# Patient Record
Sex: Female | Born: 1995 | Race: White | Hispanic: No | Marital: Single | Smoking: Former smoker
Health system: Southern US, Community
[De-identification: ages and names within clinical notes are randomized; demographics above are authoritative.]

## PROBLEM LIST (undated history)

## (undated) ENCOUNTER — Inpatient Hospital Stay (HOSPITAL_COMMUNITY): Payer: Self-pay

## (undated) DIAGNOSIS — F32A Depression, unspecified: Secondary | ICD-10-CM

## (undated) DIAGNOSIS — O24419 Gestational diabetes mellitus in pregnancy, unspecified control: Secondary | ICD-10-CM

## (undated) DIAGNOSIS — N200 Calculus of kidney: Secondary | ICD-10-CM

## (undated) DIAGNOSIS — N10 Acute tubulo-interstitial nephritis: Secondary | ICD-10-CM

## (undated) DIAGNOSIS — G43909 Migraine, unspecified, not intractable, without status migrainosus: Secondary | ICD-10-CM

## (undated) DIAGNOSIS — E282 Polycystic ovarian syndrome: Secondary | ICD-10-CM

## (undated) DIAGNOSIS — F419 Anxiety disorder, unspecified: Secondary | ICD-10-CM

## (undated) DIAGNOSIS — F329 Major depressive disorder, single episode, unspecified: Secondary | ICD-10-CM

## (undated) HISTORY — DX: Polycystic ovarian syndrome: E28.2

## (undated) HISTORY — DX: Depression, unspecified: F32.A

## (undated) HISTORY — DX: Calculus of kidney: N20.0

## (undated) HISTORY — PX: OTHER SURGICAL HISTORY: SHX169

## (undated) HISTORY — DX: Gestational diabetes mellitus in pregnancy, unspecified control: O24.419

## (undated) HISTORY — DX: Anxiety disorder, unspecified: F41.9

## (undated) HISTORY — PX: TONSILLECTOMY AND ADENOIDECTOMY: SHX28

## (undated) HISTORY — DX: Major depressive disorder, single episode, unspecified: F32.9

---
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
## 2017-01-28 IMAGING — DX DG KNEE COMPLETE 4+V*L*
4 series · 4 of 4 positions shown · non-contrast
Comparison: None.

CLINICAL DATA: Left knee pain when bearing weight for 2 days

EXAM:
LEFT KNEE - COMPLETE 4+ VIEW

[knee ap]
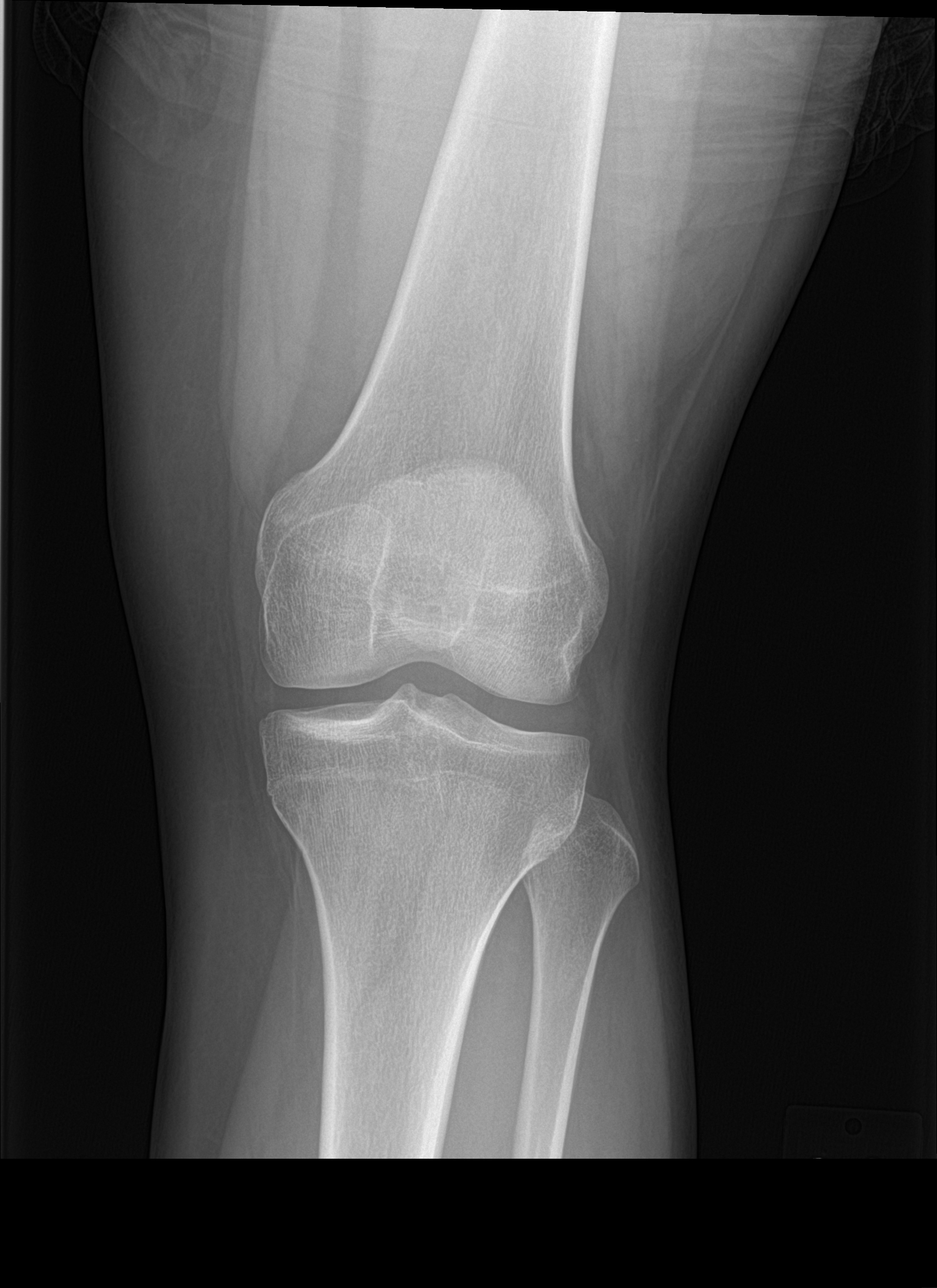

[knee lat]
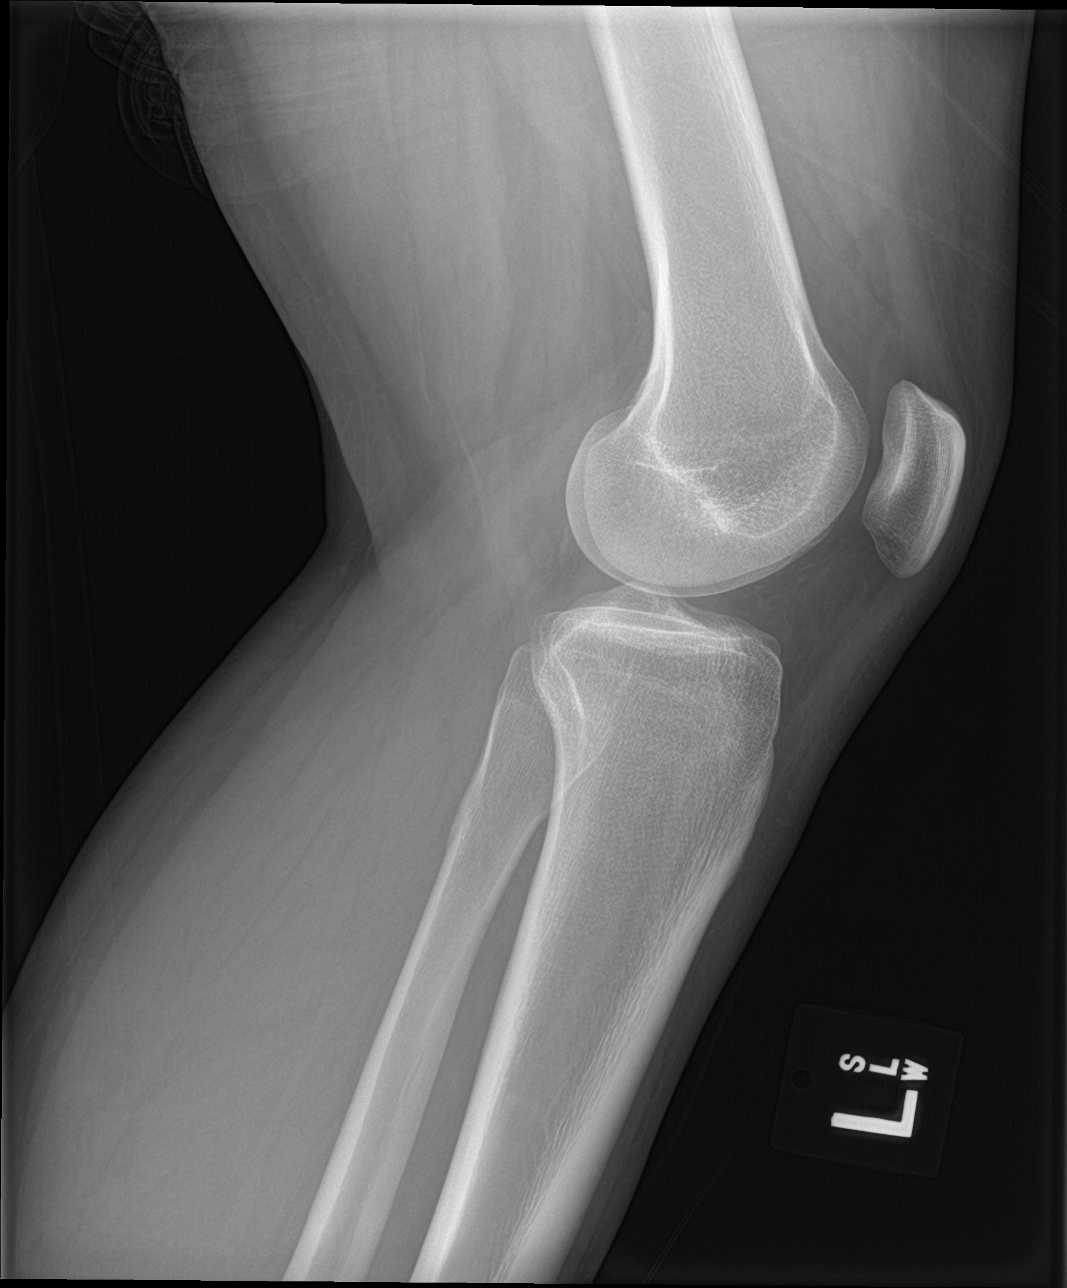

[knee obl (1 of 2)]
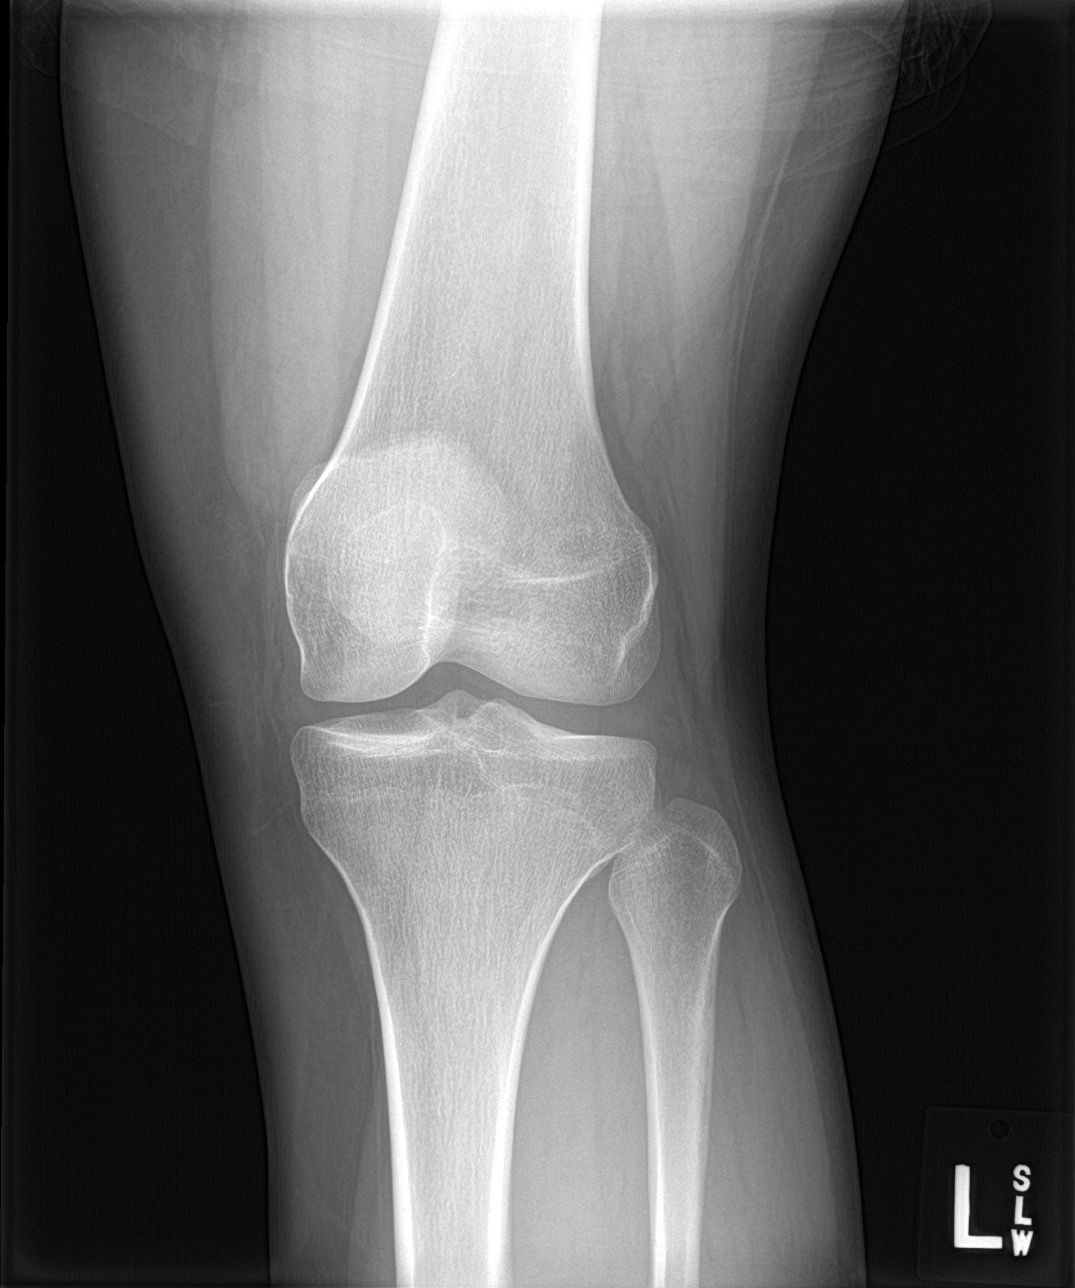

[knee obl (2 of 2)]
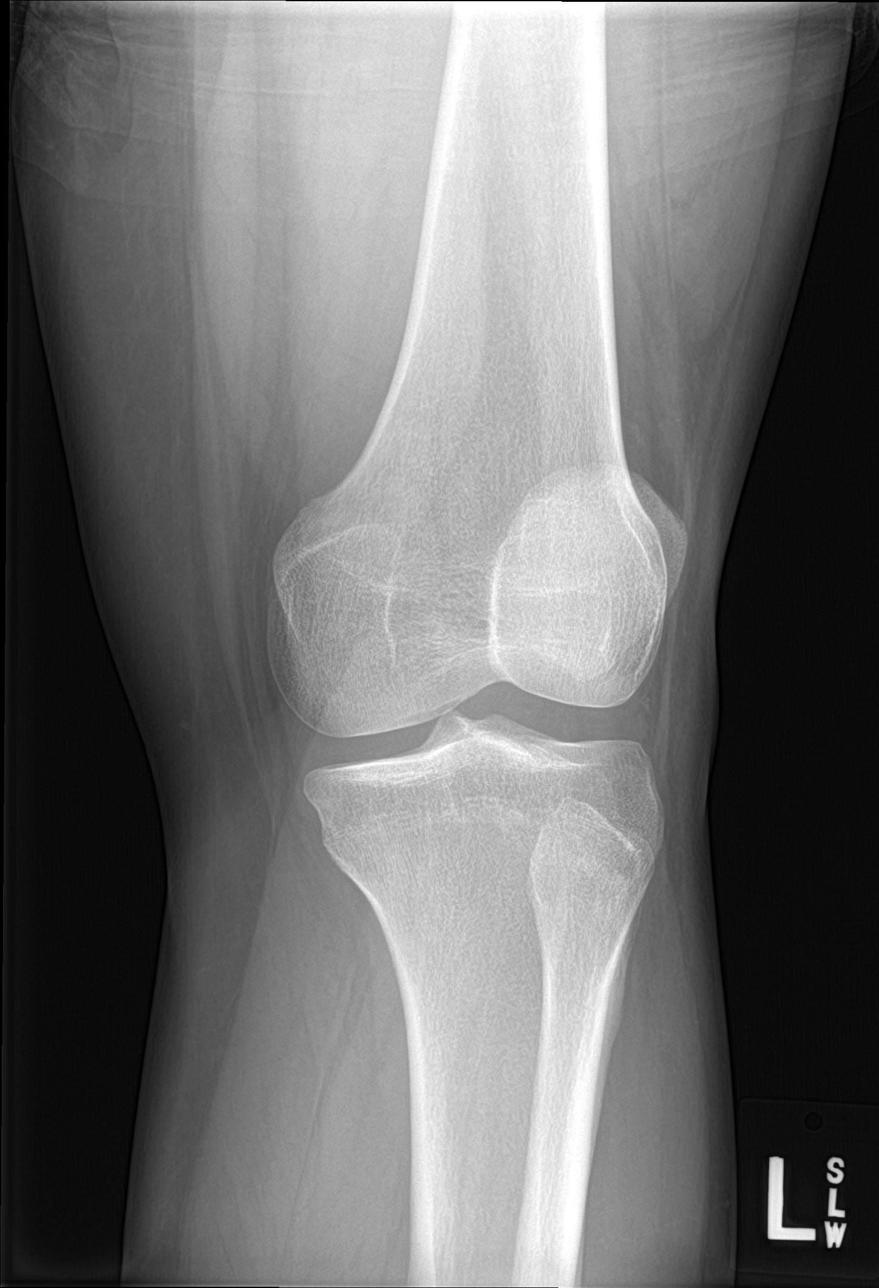

[4 of 4 positions shown; findings below may reference images not displayed]

FINDINGS: No evidence of fracture, dislocation, or joint effusion. No evidence
of arthropathy or other focal bone abnormality. Soft tissues are
unremarkable.
IMPRESSION: No acute osseous injury of the left knee.

## 2017-02-20 ENCOUNTER — Ambulatory Visit: Payer: Medicaid Other | Admitting: *Deleted

## 2017-05-23 NOTE — Progress Notes (Signed)
Chief Complaint  Patient presents with  . New Patient (Initial Visit)    female issues, discharge with foul odor x 2 weeks, tried gel supp and pills with no relieft continues to come back. Pt dates women is not sure if this is relevant to her condition    HPI  Vaginal odor Intermittent for 2 years Took the gel and the pills for bacterial infection  She is a lesbian Denies any pelvic pain Patient's last menstrual period was 05/18/2017. She uses strap on and dildos She washes them with every use She uses an organic lube with tea tree oil  anxiety and depression Patient reports anxiety and depression She has been treated with CBT and medications in the past She states that she has a stressful at home life  She states that she does not get along with her mom She states that she feels tension at home She states that she would want to please her mom but feels like she cannot She is not financially ready to move out on her own right now She states that she does not have a good relationship with her real father or her adoptive father She states that her sister passed away from accidental drowning a year ago She states that she has some racial tension in her relationship She is working right now and will be going to school She states that she works at [employer] at [employer]  GAD 7 : Generalized Anxiety Score 05/24/2017  Nervous, Anxious, on Edge 1  Control/stop worrying 2  Worry too much - different things 2  Trouble relaxing 3  Restless 3  Easily annoyed or irritable 3  Afraid - awful might happen 0  Total GAD 7 Score 14  Anxiety Difficulty Very difficult    Depression screen PHQ 2/9 05/24/2017  Decreased Interest 2  Down, Depressed, Hopeless 1  PHQ - 2 Score 3  Altered sleeping 3  Tired, decreased energy 3  Change in appetite 3  Feeling bad or failure about yourself  0  Trouble concentrating 0  Moving slowly or fidgety/restless 2  Suicidal thoughts 0  PHQ-9 Score  14      Past Medical History:  Diagnosis Date  . Anxiety   . Depression   . Migraine   . PCOS (polycystic ovarian syndrome)     Current Outpatient Medications  Medication Sig Dispense Refill  . fluconazole (DIFLUCAN) 150 MG tablet Take 1 tablet (150 mg total) by mouth once for 1 dose. Repeat in 3 day 2 tablet 0  . metroNIDAZOLE (FLAGYL) 500 MG tablet Take 1 tablet (500 mg total) by mouth 2 (two) times daily for 7 days. 14 tablet 0   No current facility-administered medications for this visit.     Allergies:  Allergies  Allergen Reactions  . Other Anaphylaxis, Cough and Rash    Dustmites, cats  . Sulfur Shortness Of Breath and Swelling  . Cat Hair Extract   . Influenza Vac Typ  [Flu Virus Vaccine]   . Omnicef [Cefdinir]   . Sulfa Antibiotics   . Sulfamethoxazole Swelling    Past Surgical History:  Procedure Laterality Date  . adnoidectomy and tonsilectomy    . TONSILLECTOMY AND ADENOIDECTOMY      Social History   Socioeconomic History  . Marital status: Single    Spouse name: None  . Number of children: None  . Years of education: None  . Highest education level: None  Social Needs  . Financial resource strain: None  .  Food insecurity - worry: None  . Food insecurity - inability: None  . Transportation needs - medical: None  . Transportation needs - non-medical: None  Occupational History  . None  Tobacco Use  . Smoking status: Never Smoker  . Smokeless tobacco: Never Used  Substance and Sexual Activity  . Alcohol use: No  . Drug use: No  . Sexual activity: Yes    Birth control/protection: None, Pill    Comment: With a woman  Other Topics Concern  . None  Social History Narrative  . None    Family History  Problem Relation Age of Onset  . Cancer Father   . Diabetes Father   . Diabetes Paternal Grandmother      ROS Review of Systems See HPI Constitution: No fevers or chills No malaise No diaphoresis Skin: No rash or itching Eyes: no  blurry vision, no double vision GU: no dysuria or hematuria Neuro: no dizziness or headaches  all others reviewed and negative   Objective: Vitals:   05/24/17 0810  BP: 112/76  Pulse: 95  Resp: 16  Temp: 98.7 F (37.1 C)  TempSrc: Oral  SpO2: 97%  Weight: 184 lb 3.2 oz (83.6 kg)  Height: 5' 4.25" (1.632 m)    Physical Exam  Constitutional: She is oriented to person, place, and time. She appears well-developed and well-nourished.  HENT:  Head: Normocephalic and atraumatic.  Eyes: Conjunctivae and EOM are normal.  Cardiovascular: Normal rate, regular rhythm and normal heart sounds.  No murmur heard. Pulmonary/Chest: Effort normal and breath sounds normal. No stridor. No respiratory distress.  Neurological: She is alert and oriented to person, place, and time.  Psychiatric: She has a normal mood and affect. Her behavior is normal. Judgment and thought content normal.   Vaginal exam- chaperone present Labia normal bilaterally without skin lesions Urethral meatus normal appearing without erythema Vagina with white, thick discharge No CMT, ovaries small and not palpable Uterus midline, nontender  Component     Latest Ref Rng & Units 05/24/2017  Yeast by KOH     Absent Absent  Yeast by wet prep     Absent Absent  WBC by wet prep     Few Few  Clue Cells Wet Prep HPF POC     None Too numerous to count (A)  Trich by wet prep     Absent Absent  Bacteria Wet Prep HPF POC     Few Too numerous to count (A)  Epithelial Cells By [employer]bermaidWet Pref ([facility])     None, Few, Too numerous to count None  RBC,UR,HPF,POC     None RBC/hpf None    Assessment and Plan Rudi was seen today for new patient (initial visit).  Diagnoses and all orders for this visit:  Vaginal discharge- will treat for BV and have pt take diflucan after completing the flagyl -     POCT Wet + KOH Prep  Anxiety and depression-  Advised continued counseling  Other orders -     Cancel: Flu Vaccine QUAD 36+ mos  IM -     Cancel: Tdap vaccine greater than or equal to 7yo IM -     fluconazole (DIFLUCAN) 150 MG tablet; Take 1 tablet (150 mg total) by mouth once for 1 dose. Repeat in 3 day -     metroNIDAZOLE (FLAGYL) 500 MG tablet; Take 1 tablet (500 mg total) by mouth 2 (two) times daily for 7 days.     Tylia Ewell A Keyleen Cerrato

## 2017-05-24 ENCOUNTER — Other Ambulatory Visit: Payer: Self-pay

## 2017-05-24 ENCOUNTER — Ambulatory Visit (INDEPENDENT_AMBULATORY_CARE_PROVIDER_SITE_OTHER): Payer: Self-pay | Admitting: Family Medicine

## 2017-05-24 ENCOUNTER — Encounter: Payer: Self-pay | Admitting: Family Medicine

## 2017-05-24 VITALS — BP 112/76 | HR 95 | Temp 98.7°F | Resp 16 | Ht 64.25 in | Wt 184.2 lb

## 2017-05-24 DIAGNOSIS — N898 Other specified noninflammatory disorders of vagina: Secondary | ICD-10-CM

## 2017-05-24 DIAGNOSIS — F419 Anxiety disorder, unspecified: Secondary | ICD-10-CM

## 2017-05-24 DIAGNOSIS — F329 Major depressive disorder, single episode, unspecified: Secondary | ICD-10-CM

## 2017-05-24 LAB — POCT WET + KOH PREP
TRICH BY WET PREP: ABSENT
YEAST BY KOH: ABSENT
YEAST BY WET PREP: ABSENT

## 2017-05-24 MED ORDER — METRONIDAZOLE 500 MG PO TABS: 500.0000 mg | ORAL_TABLET | Freq: Two times a day (BID) | ORAL | 0 refills | Status: AC

## 2017-05-24 MED ORDER — FLUCONAZOLE 150 MG PO TABS: 150.0000 mg | ORAL_TABLET | Freq: Once | ORAL | 0 refills | Status: AC

## 2017-05-24 NOTE — Patient Instructions (Addendum)
Use clear mineral oil as a lubricant  Take flagyl 500mg  by mouth in the morning and in the evening  After finishing flagyl start Diflucan one tab then repeat in 3 days     IF you received an x-ray today, you will receive an invoice from [facility]. Please contact [facility] at [phone] with questions or concerns regarding your invoice.   IF you received labwork today, you will receive an invoice from [place]. Please contact [facility] at [phone] with questions or concerns regarding your invoice.   Our billing staff will not be able to assist you with questions regarding bills from these companies.  You will be contacted with the lab results as soon as they are available. The fastest way to get your results is to activate your My Chart account. Instructions are located on the last page of this paperwork. If you have not heard from [place] regarding the results in 2 weeks, please contact this office.     Vaginitis Vaginitis is a condition in which the vaginal tissue swells and becomes red (inflamed). This condition is most often caused by a change in the normal balance of bacteria and yeast that live in the vagina. This change causes an overgrowth of certain bacteria or yeast, which causes the inflammation. There are different types of vaginitis, but the most common types are:  Bacterial vaginosis.  Yeast infection (candidiasis).  Trichomoniasis vaginitis. This is a sexually transmitted disease (STD).  Viral vaginitis.  Atrophic vaginitis.  Allergic vaginitis.  What are the causes? The cause of this condition depends on the type of vaginitis. It can be caused by:  Bacteria (bacterial vaginosis).  Yeast, which is a fungus (yeast infection).  A parasite (trichomoniasis vaginitis).  A virus (viral vaginitis).  Low hormone levels (atrophic vaginitis). Low hormone levels can occur during pregnancy, breastfeeding, or after menopause.  Irritants, such as  bubble baths, scented tampons, and feminine sprays (allergic vaginitis).  Other factors can change the normal balance of the yeast and bacteria that live in the vagina. These include:  Antibiotic medicines.  Poor hygiene.  Diaphragms, vaginal sponges, spermicides, birth control pills, and intrauterine devices (IUD).  Sex.  Infection.  Uncontrolled diabetes.  A weakened defense (immune) system.  What increases the risk? This condition is more likely to develop in women who:  Smoke.  Use vaginal douches, scented tampons, or scented sanitary pads.  Wear tight-fitting pants.  Wear thong underwear.  Use oral birth control pills or an IUD.  Have sex without a condom.  Have multiple sex partners.  Have an STD.  Frequently use the spermicide nonoxynol-9.  Eat lots of foods high in sugar.  Have uncontrolled diabetes.  Have low estrogen levels.  Have a weakened immune system from an immune disorder or medical treatment.  Are pregnant or breastfeeding.  What are the signs or symptoms? Symptoms vary depending on the cause of the vaginitis. Common symptoms include:  Abnormal vaginal discharge. ? The discharge is white, gray, or yellow with bacterial vaginosis. ? The discharge is thick, white, and cheesy with a yeast infection. ? The discharge is frothy and yellow or greenish with trichomoniasis.  A bad vaginal smell. The smell is fishy with bacterial vaginosis.  Vaginal itching, pain, or swelling.  Sex that is painful.  Pain or burning when urinating.  Sometimes there are no symptoms. How is this diagnosed? This condition is diagnosed based on your symptoms and medical history. A physical exam, including a pelvic exam, will also be  done. You may also have other tests, including:  Tests to determine the pH level (acidity or alkalinity) of your vagina.  A whiff test, to assess the odor that results when a sample of your vaginal discharge is mixed with a  potassium hydroxide solution.  Tests of vaginal fluid. A sample will be examined under a microscope.  How is this treated? Treatment varies depending on the type of vaginitis you have. Your treatment may include:  Antibiotic creams or pills to treat bacterial vaginosis and trichomoniasis.  Antifungal medicines, such as vaginal creams or suppositories, to treat a yeast infection.  Medicine to ease discomfort if you have viral vaginitis. Your sexual partner should also be treated.  Estrogen delivered in a cream, pill, suppository, or vaginal ring to treat atrophic vaginitis. If vaginal dryness occurs, lubricants and moisturizing creams may help. You may need to avoid scented soaps, sprays, or douches.  Stopping use of a product that is causing allergic vaginitis. Then using a vaginal cream to treat the symptoms.  Follow these instructions at home: Lifestyle  Keep your genital area clean and dry. Avoid soap, and only rinse the area with water.  Do not douche or use tampons until your health care provider says it is okay to do so. Use sanitary pads, if needed.  Do not have sex until your health care provider approves. When you can return to sex, practice safe sex and use condoms.  Wipe from front to back. This avoids the spread of bacteria from the rectum to the vagina. General instructions  Take over-the-counter and prescription medicines only as told by your health care provider.  If you were prescribed an antibiotic medicine, take or use it as told by your health care provider. Do not stop taking or using the antibiotic even if you start to feel better.  Keep all follow-up visits as told by your health care provider. This is important. How is this prevented?  Use mild, non-scented products. Do not use things that can irritate the vagina, such as fabric softeners. Avoid the following products if they are scented: ? Feminine sprays. ? Detergents. ? Tampons. ? Feminine hygiene  products. ? Soaps or bubble baths.  Let air reach your genital area. ? Wear cotton underwear to reduce moisture buildup. ? Avoid wearing underwear while you sleep. ? Avoid wearing tight pants and underwear or nylons without a cotton panel. ? Avoid wearing thong underwear.  Take off any wet clothing, such as bathing suits, as soon as possible.  Practice safe sex and use condoms. Contact a health care provider if:  You have abdominal pain.  You have a fever.  You have symptoms that last for more than 2-3 days. Get help right away if:  You have a fever and your symptoms suddenly get worse. Summary  Vaginitis is a condition in which the vaginal tissue becomes inflamed.This condition is most often caused by a change in the normal balance of bacteria and yeast that live in the vagina.  Treatment varies depending on the type of vaginitis you have.  Do not douche, use tampons , or have sex until your health care provider approves. When you can return to sex, practice safe sex and use condoms. This information is not intended to replace advice given to you by your health care provider. Make sure you discuss any questions you have with your health care provider. Document Released: 01/16/2007 Document Revised: 04/26/2016 Document Reviewed: 04/26/2016 Elsevier Interactive Patient Education  [employer]2018 [employer]

## 2017-08-21 ENCOUNTER — Ambulatory Visit (INDEPENDENT_AMBULATORY_CARE_PROVIDER_SITE_OTHER): Payer: Self-pay | Admitting: Family Medicine

## 2017-08-21 ENCOUNTER — Other Ambulatory Visit: Payer: Self-pay

## 2017-08-21 ENCOUNTER — Encounter: Payer: Self-pay | Admitting: Family Medicine

## 2017-08-21 VITALS — BP 102/64 | HR 97 | Temp 98.9°F | Resp 17 | Ht 64.26 in | Wt 175.4 lb

## 2017-08-21 DIAGNOSIS — B9689 Other specified bacterial agents as the cause of diseases classified elsewhere: Secondary | ICD-10-CM

## 2017-08-21 DIAGNOSIS — N760 Acute vaginitis: Secondary | ICD-10-CM

## 2017-08-21 DIAGNOSIS — N898 Other specified noninflammatory disorders of vagina: Secondary | ICD-10-CM

## 2017-08-21 LAB — POCT WET + KOH PREP
TRICH BY WET PREP: ABSENT
YEAST BY WET PREP: ABSENT
Yeast by KOH: ABSENT

## 2017-08-21 MED ORDER — FLUCONAZOLE 150 MG PO TABS: 150.0000 mg | ORAL_TABLET | Freq: Every day | ORAL | 0 refills | Status: DC

## 2017-08-21 MED ORDER — METRONIDAZOLE 500 MG PO TABS: 500.0000 mg | ORAL_TABLET | Freq: Two times a day (BID) | ORAL | 0 refills | Status: DC

## 2017-08-21 NOTE — Progress Notes (Signed)
Chief Complaint  Patient presents with  . bacterial vaginosis is back per pt -took medicine and finish    sweating randomly x 3 weeks and is getting progressively worse    HPI   No douching No bubble bath She is using unscented products to launder She does not feel like her BV ever cleared up She uses dildos for intercourse and cleans them well  No fevers or chills She tried probiotics On uti symptoms such as dysuria or hematuria   Past Medical History:  Diagnosis Date  . Anxiety   . Depression   . Migraine   . PCOS (polycystic ovarian syndrome)     Current Outpatient Medications  Medication Sig Dispense Refill  . fluconazole (DIFLUCAN) 150 MG tablet Take 1 tablet (150 mg total) by mouth daily. 7 tablet 0  . metroNIDAZOLE (FLAGYL) 500 MG tablet Take 1 tablet (500 mg total) by mouth 2 (two) times daily. 14 tablet 0   No current facility-administered medications for this visit.     Allergies:  Allergies  Allergen Reactions  . Other Anaphylaxis, Cough and Rash    Dustmites, cats  . Sulfur Shortness Of Breath and Swelling  . Cat Hair Extract   . Influenza Vac Typ  [Flu Virus Vaccine]   . Omnicef [Cefdinir]   . Sulfa Antibiotics   . Sulfamethoxazole Swelling    Past Surgical History:  Procedure Laterality Date  . adnoidectomy and tonsilectomy    . TONSILLECTOMY AND ADENOIDECTOMY      Social History   Socioeconomic History  . Marital status: Single    Spouse name: Not on file  . Number of children: Not on file  . Years of education: Not on file  . Highest education level: Not on file  Occupational History  . Not on file  Social Needs  . Financial resource strain: Not on file  . Food insecurity:    Worry: Not on file    Inability: Not on file  . Transportation needs:    Medical: Not on file    Non-medical: Not on file  Tobacco Use  . Smoking status: Never Smoker  . Smokeless tobacco: Never Used  Substance and Sexual Activity  . Alcohol use: No    . Drug use: No  . Sexual activity: Yes    Birth control/protection: None, Pill    Comment: With a woman  Lifestyle  . Physical activity:    Days per week: Not on file    Minutes per session: Not on file  . Stress: Not on file  Relationships  . Social connections:    Talks on phone: Not on file    Gets together: Not on file    Attends religious service: Not on file    Active member of club or organization: Not on file    Attends meetings of clubs or organizations: Not on file    Relationship status: Not on file  Other Topics Concern  . Not on file  Social History Narrative  . Not on file    Family History  Problem Relation Age of Onset  . Cancer Father   . Diabetes Father   . Diabetes Paternal Grandmother      ROS Review of Systems See HPI Constitution: No fevers or chills No malaise No diaphoresis Skin: No rash or itching Eyes: no blurry vision, no double vision GU: no dysuria or hematuria Neuro: no dizziness or headaches  all others reviewed and negative   Objective: Vitals:  08/21/17 1224  BP: 102/64  Pulse: 97  Resp: 17  Temp: 98.9 F (37.2 C)  TempSrc: Oral  SpO2: 100%  Weight: 175 lb 6.4 oz (79.6 kg)  Height: 5' 4.26" (1.632 m)    Physical Exam  Constitutional: She appears well-developed and well-nourished.  HENT:  Head: Normocephalic and atraumatic.  Eyes: Conjunctivae and EOM are normal.  Pulmonary/Chest: Effort normal.  Skin: Skin is warm. Capillary refill takes less than 2 seconds.  Psychiatric: She has a normal mood and affect. Her behavior is normal. Judgment and thought content normal.   Vaginal exam- Chaperone Present Labia with external erythema, pt has shaved, no vesicle Urethral meatus normal appearing without erythema Vagina with white, scant discharge No CMT, ovaries small and not palpable Uterus midline, nontender  Assessment and Plan Dorma was seen today for bacterial vaginosis is back per pt -took medicine and  finish.  Diagnoses and all orders for this visit:  Vaginal discharge -     POCT Wet + KOH Prep  BV (bacterial vaginosis)  Other orders -     metroNIDAZOLE (FLAGYL) 500 MG tablet; Take 1 tablet (500 mg total) by mouth 2 (two) times daily. -     fluconazole (DIFLUCAN) 150 MG tablet; Take 1 tablet (150 mg total) by mouth daily.    Advised otc azo probiotic for vaginal health Discussed flagyl and diflucan for one week   Sacheen Arrasmith A Creta Levin

## 2017-08-21 NOTE — Patient Instructions (Signed)
     IF you received an x-ray today, you will receive an invoice from [facility]. Please contact [facility] at [phone] with questions or concerns regarding your invoice.   IF you received labwork today, you will receive an invoice from [facility]. Please contact [facility] at [phone] with questions or concerns regarding your invoice.   Our billing staff will not be able to assist you with questions regarding bills from these companies.  You will be contacted with the lab results as soon as they are available. The fastest way to get your results is to activate your My Chart account. Instructions are located on the last page of this paperwork. If you have not heard from us regarding the results in 2 weeks, please contact this office.     

## 2017-10-15 ENCOUNTER — Encounter: Payer: Self-pay | Admitting: Obstetrics & Gynecology

## 2017-10-18 ENCOUNTER — Other Ambulatory Visit (HOSPITAL_COMMUNITY)
Admission: RE | Admit: 2017-10-18 | Discharge: 2017-10-18 | Disposition: A | Discharge status: Home / Self Care | Payer: Medicaid Other | Source: Ambulatory Visit | Attending: Obstetrics & Gynecology | Admitting: Obstetrics & Gynecology

## 2017-10-18 ENCOUNTER — Ambulatory Visit: Payer: Self-pay | Admitting: Obstetrics & Gynecology

## 2017-10-18 ENCOUNTER — Encounter: Payer: Self-pay | Admitting: Obstetrics & Gynecology

## 2017-10-18 VITALS — BP 115/69 | HR 83 | Resp 16 | Ht 64.0 in | Wt 175.0 lb

## 2017-10-18 DIAGNOSIS — N898 Other specified noninflammatory disorders of vagina: Secondary | ICD-10-CM | POA: Diagnosis present

## 2017-10-18 MED ORDER — METRONIDAZOLE 500 MG PO TABS
500.0000 mg | ORAL_TABLET | Freq: Two times a day (BID) | ORAL | 3 refills | Status: DC
Start: 2017-10-18 — End: 2017-12-19

## 2017-10-18 NOTE — Progress Notes (Signed)
   Subjective:    Patient ID: Christie Reyes, female    DOB: 10/06/1995, 22 y.o.   MRN: [id number]  HPI 22 yo single G0 here for recurrent BV. She reports that she also sees a Dr. Creta LevinStallings for the same issue.  In the past I had prescribed OCPs for oligomenorrhea. She is not taking them and her periods have returned to coming monthly.  Review of Systems She is in a same sex relationship.    Objective:   Physical Exam Breathing, conversing, and ambulating normally Well nourished, well hydrated White female, no apparent distress Speculum exam reveals that she is starting her period Her discharge is c/w BV.     Assessment & Plan:  Recurrent BV Flagyl with 3 refills Rec boric acid suppositories and probiotics

## 2017-10-18 NOTE — Progress Notes (Signed)
[facility] not pay for her visit and she will pay for the visit and lab.

## 2017-10-19 LAB — CERVICOVAGINAL ANCILLARY ONLY
BACTERIAL VAGINITIS: POSITIVE — AB
CANDIDA VAGINITIS: NEGATIVE

## 2017-11-09 ENCOUNTER — Telehealth: Payer: Self-pay | Admitting: *Deleted

## 2017-12-19 ENCOUNTER — Other Ambulatory Visit: Payer: Self-pay

## 2017-12-19 ENCOUNTER — Ambulatory Visit (INDEPENDENT_AMBULATORY_CARE_PROVIDER_SITE_OTHER): Payer: Self-pay | Admitting: Physician Assistant

## 2017-12-19 ENCOUNTER — Encounter: Payer: Self-pay | Admitting: Physician Assistant

## 2017-12-19 VITALS — BP 108/74 | HR 109 | Temp 98.4°F | Resp 16 | Ht 63.0 in | Wt 181.0 lb

## 2017-12-19 DIAGNOSIS — F329 Major depressive disorder, single episode, unspecified: Secondary | ICD-10-CM

## 2017-12-19 DIAGNOSIS — Z8742 Personal history of other diseases of the female genital tract: Secondary | ICD-10-CM

## 2017-12-19 DIAGNOSIS — F32A Depression, unspecified: Secondary | ICD-10-CM

## 2017-12-19 DIAGNOSIS — Z309 Encounter for contraceptive management, unspecified: Secondary | ICD-10-CM

## 2017-12-19 LAB — POCT URINE PREGNANCY: Preg Test, Ur: NEGATIVE

## 2017-12-19 MED ORDER — ESCITALOPRAM OXALATE 10 MG PO TABS: 10.0000 mg | ORAL_TABLET | Freq: Every day | ORAL | 1 refills | Status: DC

## 2017-12-19 MED ORDER — LEVONORGESTREL-ETHINYL ESTRAD 0.1-20 MG-MCG PO TABS: 1.0000 | ORAL_TABLET | Freq: Every day | ORAL | 11 refills | Status: DC

## 2017-12-19 NOTE — Patient Instructions (Addendum)
  1) Prozac: Initial: 20 mg once daily; after 2 weeks if needed, you may increase the dose to 40mg /day   Come back and see me in 4 weeks to recheck.  Best,  Whitney McVey, PA-C   IF you received an x-ray today, you will receive an invoice from [facility]. Please contact [facility] at [phone] with questions or concerns regarding your invoice.   IF you received labwork today, you will receive an invoice from [facility]. Please contact [facility] at [phone] with questions or concerns regarding your invoice.   Our billing staff will not be able to assist you with questions regarding bills from these companies.  You will be contacted with the lab results as soon as they are available. The fastest way to get your results is to activate your My Chart account. Instructions are located on the last page of this paperwork. If you have not heard from us regarding the results in 2 weeks, please contact this office.

## 2017-12-19 NOTE — Progress Notes (Signed)
Christie Reyes  MRN: [id number] DOB: [birth year]  PCP: Patient, No Pcp Per  Subjective:  Pt is a 22 year old female who presents to clinic for several complaints.  1) depression x 2 months.  She has taken Zoloft for a month. "I had bad mood swings and it kind of made it worse" She was admitted to psychiatric hospital a few years ago for depression and suicidal ideations, this helped her quite a bit. She has not spoken with a therapist in several years. Today she denies SI or HI.  2) She has been seen in the past at GYN for PCOS. Was never treated.   3) she is interested in getting back on birth control.  She has had one female partner for the past 5 years.  Endorses recurrent BV infections, refractory to treatment.  She is interested in having sex with men.  She has had one female sexual partner recently, used condoms.  Denies missed menstrual cycle. LMP one month ago.   Wt Readings from Last 3 Encounters:  12/19/17 181 lb (82.1 kg) (95 %, Z= 1.62)*  10/18/17 175 lb (79.4 kg) (93 %, Z= 1.50)*  08/21/17 175 lb 6.4 oz (79.6 kg) (94 %, Z= 1.52)*   * Growth percentiles are based on CDC (Girls, 2-20 Years) data.   Review of Systems  Genitourinary: Negative for menstrual problem, pelvic pain, vaginal bleeding, vaginal discharge and vaginal pain.  Psychiatric/Behavioral: Positive for dysphoric mood. Negative for self-injury and suicidal ideas. The patient is nervous/anxious.     There are no active problems to display for this patient.   Current Outpatient Medications on File Prior to Visit  Medication Sig Dispense Refill  . EPINEPHrine (EPIPEN JR) 0.15 MG/0.3ML injection Inject into the muscle.     No current facility-administered medications on file prior to visit.     Allergies  Allergen Reactions  . Other Anaphylaxis, Cough and Rash    Dustmites, cats  . Sulfur Shortness Of Breath and Swelling  . Cat Hair Extract   . Influenza Vac Typ  [Flu Virus Vaccine]   . Omnicef  [Cefdinir]   . Sulfa Antibiotics   . Sulfamethoxazole Swelling     Objective:  BP 108/74 (BP Location: Right Arm, Patient Position: Sitting, Cuff Size: Normal)   Pulse (!) 109   Temp 98.4 F (36.9 C) (Oral)   Resp 16   Ht 5\' 3"  (1.6 m)   Wt 181 lb (82.1 kg)   LMP 11/18/2017   SpO2 98%   BMI 32.06 kg/m   Physical Exam  Constitutional: She appears well-developed and well-nourished. No distress.  Psychiatric: She has a normal mood and affect. Thought content normal.  Vitals reviewed.     [facility]  PHQ-9 Total Score  16      Assessment and Plan :  1. Depression, unspecified depression type - Pt is a pleasant 22 year old female who c/o depression without HI or SI x 2 months. Plan to start prozac. RTC in 4 weeks to recheck.  - FLUoxetine (PROZAC) 20 MG capsule; Take 1 capsule (20 mg total) by mouth daily. If needed, after 2 weeks may increase dose to 40mg /day.  Dispense: 30 capsule; Refill: 1 - FLUoxetine (PROZAC) 20 MG tablet; After 2 weeks of taking 20mg , if needed you can take this with 20mg  for a total of 40mg /day  Dispense: 15 tablet; Refill: 3  2. History of PCOS - Pt reports being diagnosed  with PCOS in the past. She is asymptomatic. Will check labs and contact with results.  - POCT urine pregnancy - Hemoglobin A1c - TSH+Prl+FSH+TestT+LH+DHEA S...  3. Encounter for contraceptive management, unspecified type - Urine hcg negative. Okay to start ocps - levonorgestrel-ethinyl estradiol (AVIANE) 0.1-20 MG-MCG tablet; Take 1 tablet by mouth daily.  Dispense: 1 Package; Refill: 11   Whitney Salvatore Poe, PA-C  Primary Care at [facility] 12/19/2017 2:22 PM  Please note: Portions of this report may have been transcribed using dragon voice recognition software. Every effort was made to ensure accuracy; however, inadvertent computerized transcription errors may be present.

## 2017-12-21 ENCOUNTER — Encounter: Payer: Self-pay | Admitting: Physician Assistant

## 2017-12-21 MED ORDER — FLUOXETINE HCL 20 MG PO TABS: ORAL_TABLET | ORAL | 3 refills | Status: DC

## 2017-12-21 MED ORDER — FLUOXETINE HCL 20 MG PO CAPS: 20.0000 mg | ORAL_CAPSULE | Freq: Every day | ORAL | 1 refills | Status: DC

## 2017-12-22 LAB — TSH+PRL+FSH+TESTT+LH+DHEA S...
17-Hydroxyprogesterone: 17 ng/dL
Androstenedione: 57 ng/dL (ref 41–262)
DHEA-SO4: 400 ug/dL (ref 110.0–433.2)
FSH: 2 m[IU]/mL
LH: 0.5 m[IU]/mL
Prolactin: 11.5 ng/mL (ref 4.8–23.3)
TSH: 0.5 u[IU]/mL (ref 0.450–4.500)
Testosterone, Free: 0.8 pg/mL
Testosterone: 43 ng/dL

## 2017-12-22 LAB — HEMOGLOBIN A1C
Est. average glucose Bld gHb Est-mCnc: 94 mg/dL
Hgb A1c MFr Bld: 4.9 % (ref 4.8–5.6)

## 2018-05-11 ENCOUNTER — Ambulatory Visit: Payer: Medicaid Other | Admitting: Family Medicine

## 2018-05-13 IMAGING — US US PELVIS COMPLETE
1 series · 13 of 25 positions shown · non-contrast
Comparison: None.

CLINICAL DATA: Patient with irregular periods.  Evaluate for PCOS.

EXAM:
TRANSABDOMINAL ULTRASOUND OF PELVIS
TECHNIQUE: Transabdominal ultrasound examination of the pelvis was performed
including evaluation of the uterus, ovaries, adnexal regions, and
pelvic cul-de-sac.

[Series 1: us pelvis complete · 0.18mm/px · 13 of 62 slices shown]
[im 1/62]
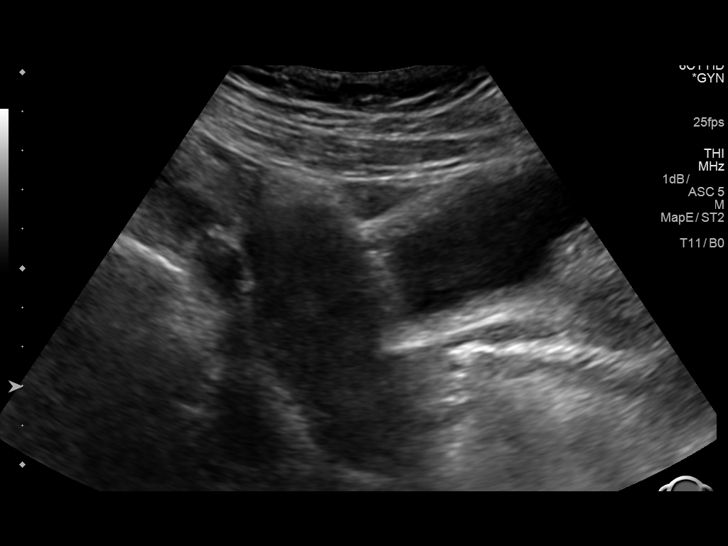
[im 6/62]
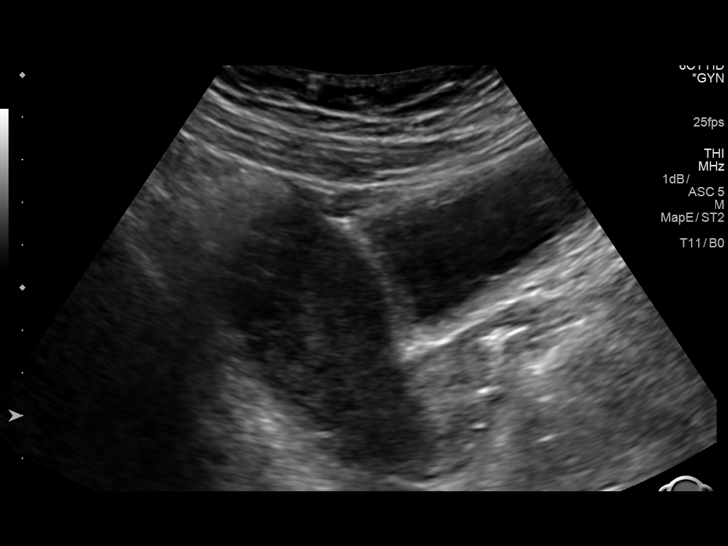
[im 11/62]
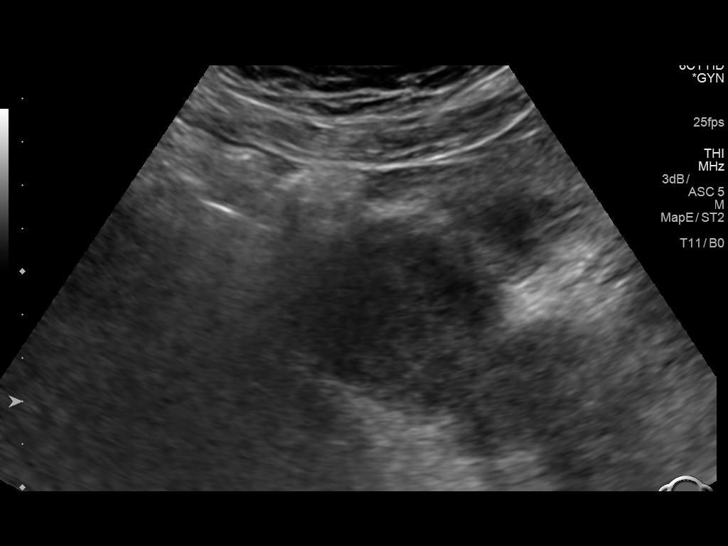
[im 16/62]
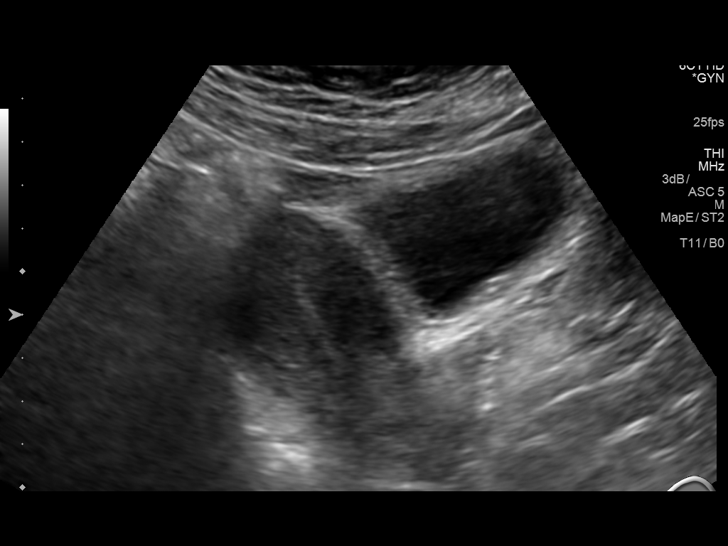
[im 21/62]
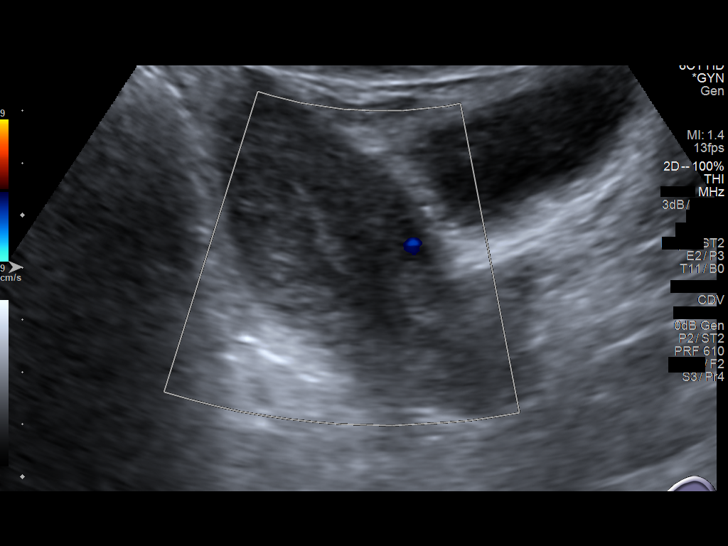
[im 26/62]
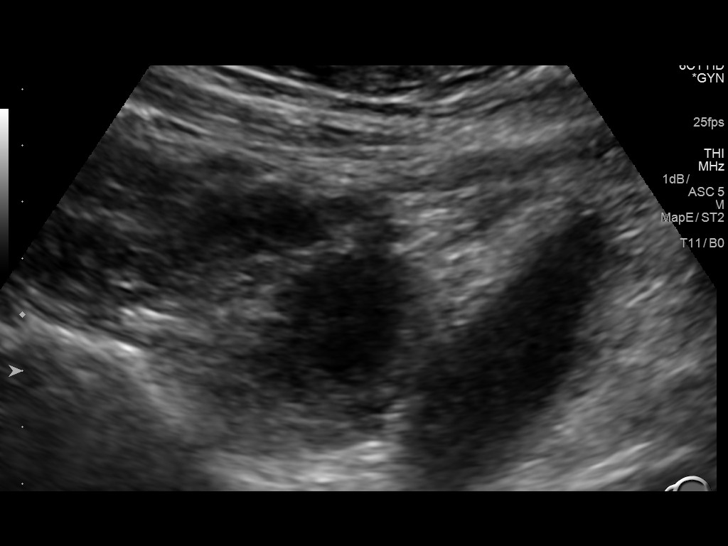
[im 31/62]
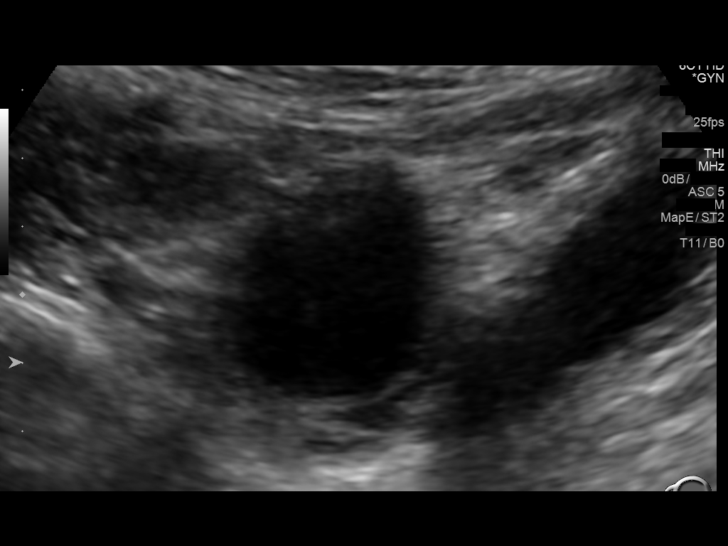
[im 36/62]
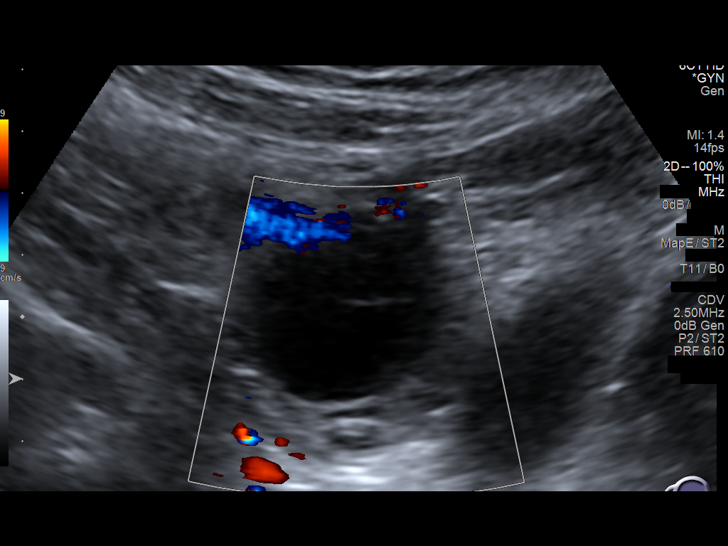
[im 41/62]
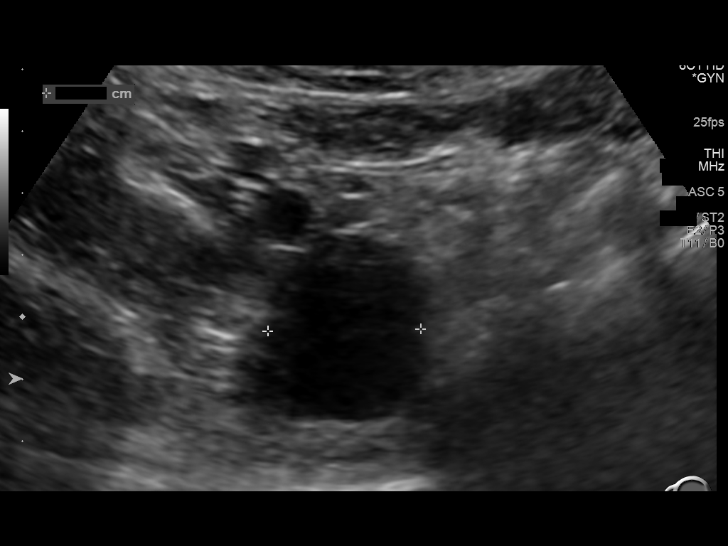
[im 46/62]
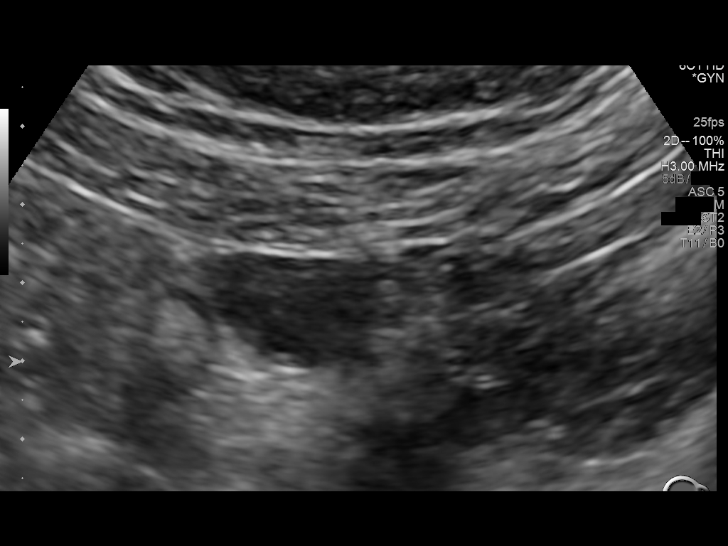
[im 51/62]
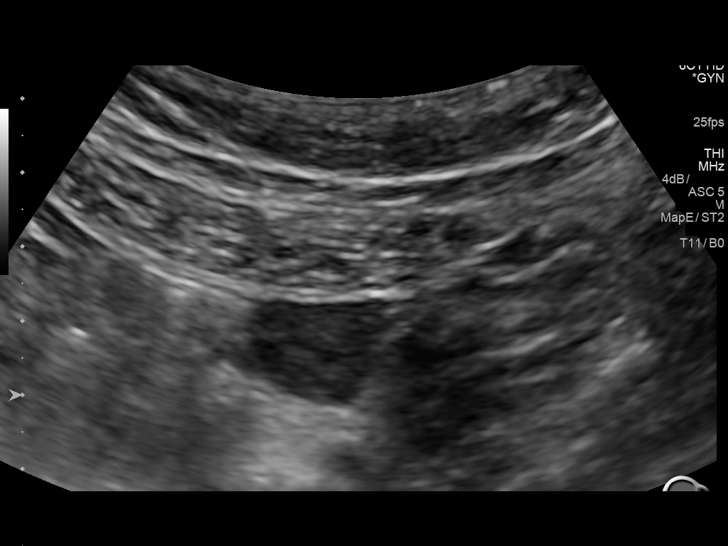
[im 56/62]
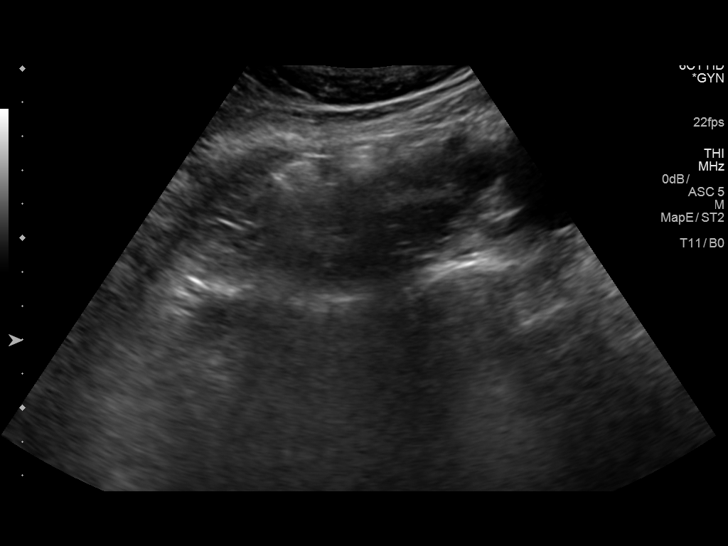
[im 62/62]
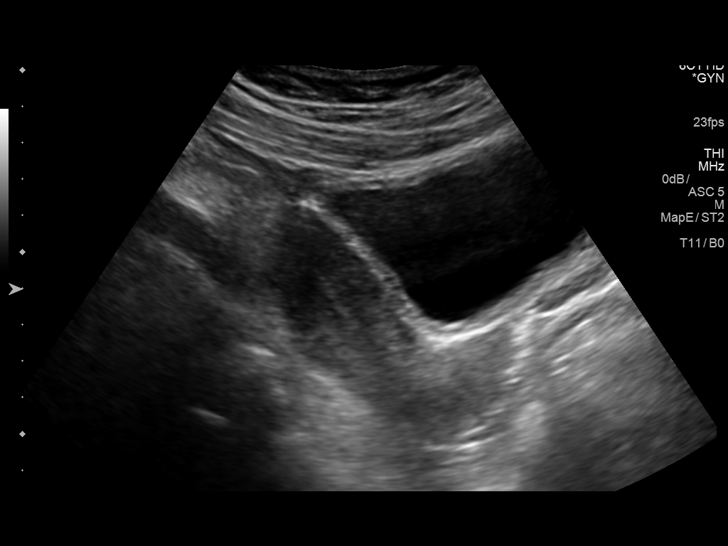

[13 of 25 positions shown; findings below may reference images not displayed]

FINDINGS: Uterus

Measurements: 8.3 x 2.8 x 4.7 cm. No fibroids or other mass
visualized.

Endometrium

Thickness: 4 mm.  No focal abnormality visualized.

Right ovary

Measurements: 4.5 x 3.9 x 3.4 cm. Within the right ovary there is a
3.1 x 2.7 x 2.5 cm hypoechoic cystic lesion.

Left ovary

Measurements: 3.0 x 1.5 x 2.4 cm. Normal appearance/no adnexal mass.

Other findings:  No abnormal free fluid.
IMPRESSION: There is a 3.1 cm hypoechoic cystic lesion within the right ovary
which may represent a hemorrhagic cyst. Recommend follow-up pelvic
ultrasound in 8 weeks to ensure resolution.

Otherwise unremarkable sonographic appearance of the right and left
ovaries.

Endometrium measures 4 mm. If bleeding remains unresponsive to
hormonal or medical therapy, sonohysterogram should be considered
for focal lesion work-up. (Ref: Radiological Reasoning: Algorithmic
Workup of Abnormal Vaginal Bleeding with Endovaginal Sonography and
Sonohysterography. AJR [id number]; 191:S68-73)

## 2018-06-28 ENCOUNTER — Telehealth: Payer: Self-pay

## 2018-06-28 NOTE — Telephone Encounter (Addendum)
Pt called needing refill of OCP. I called her back and was unable to leave a voicemail. Our office did not originally prescribe her OCP so pt is aware we will not refill via MyChart message.

## 2018-11-06 ENCOUNTER — Ambulatory Visit: Payer: Managed Care, Other (non HMO) | Admitting: Advanced Practice Midwife

## 2018-11-06 DIAGNOSIS — Z01419 Encounter for gynecological examination (general) (routine) without abnormal findings: Secondary | ICD-10-CM

## 2019-01-13 ENCOUNTER — Encounter (HOSPITAL_COMMUNITY): Payer: Self-pay

## 2019-01-13 ENCOUNTER — Other Ambulatory Visit: Payer: Self-pay

## 2019-01-13 ENCOUNTER — Ambulatory Visit (HOSPITAL_COMMUNITY)
Admission: EM | Admit: 2019-01-13 | Discharge: 2019-01-13 | Disposition: A | Discharge status: Home / Self Care | Payer: Managed Care, Other (non HMO) | Attending: Family Medicine | Admitting: Family Medicine

## 2019-01-13 DIAGNOSIS — N10 Acute tubulo-interstitial nephritis: Secondary | ICD-10-CM | POA: Diagnosis present

## 2019-01-13 DIAGNOSIS — R109 Unspecified abdominal pain: Secondary | ICD-10-CM | POA: Diagnosis present

## 2019-01-13 DIAGNOSIS — Z3202 Encounter for pregnancy test, result negative: Secondary | ICD-10-CM | POA: Diagnosis not present

## 2019-01-13 LAB — POCT URINALYSIS DIP (DEVICE)
Bilirubin Urine: NEGATIVE
Glucose, UA: NEGATIVE mg/dL
Ketones, ur: NEGATIVE mg/dL
Nitrite: NEGATIVE
Protein, ur: NEGATIVE mg/dL
Specific Gravity, Urine: 1.025 (ref 1.005–1.030)
Urobilinogen, UA: 0.2 mg/dL (ref 0.0–1.0)
pH: 6 (ref 5.0–8.0)

## 2019-01-13 LAB — POCT PREGNANCY, URINE: Preg Test, Ur: NEGATIVE

## 2019-01-13 MED ORDER — CIPROFLOXACIN HCL 500 MG PO TABS: 500.0000 mg | ORAL_TABLET | Freq: Two times a day (BID) | ORAL | 0 refills | Status: DC

## 2019-01-13 MED ORDER — HYDROCODONE-ACETAMINOPHEN 5-325 MG PO TABS: 1.0000 | ORAL_TABLET | Freq: Four times a day (QID) | ORAL | 0 refills | Status: DC | PRN

## 2019-01-13 NOTE — ED Triage Notes (Signed)
Pt reports left flank pain and abdominal pain x 1 day.

## 2019-01-13 NOTE — ED Provider Notes (Signed)
[facility]    CSN: [id number] Arrival date & time: 01/13/19  1543      History   Chief Complaint Chief Complaint  Patient presents with  . Flank Pain  . Abdominal Pain    HPI Christie Reyes Hacking is a 23 y.o. female.   HPI   Patient states that for 1 day she has had flank pain.  Started off in her left flank.  It radiated down to the left lower abdominal area.  Comes and goes.  Decreased appetite.  Urinary frequency.  Mild dysuria.  No fever or chills.  Temperature here there was 99.2.  She states it was a little bit higher at home.  She states that she has no appetite.  No nausea or vomiting, but has not felt like eating at all today.  Never had a kidney stone.  Has had bladder infections but no kidney infections.  She states both her mother and father have had kidney stones, so it would not surprise her.  Past Medical History:  Diagnosis Date  . Anxiety   . Depression   . Migraine   . PCOS (polycystic ovarian syndrome)     There are no active problems to display for this patient.   Past Surgical History:  Procedure Laterality Date  . adnoidectomy and tonsilectomy    . TONSILLECTOMY AND ADENOIDECTOMY      OB History    Gravida  0   Para      Term      Preterm      AB      Living        SAB      TAB      Ectopic      Multiple      Live Births               Home Medications    Prior to Admission medications   Medication Sig Start Date End Date Taking? Authorizing Provider  ciprofloxacin (CIPRO) 500 MG tablet Take 1 tablet (500 mg total) by mouth 2 (two) times daily. 01/13/19   Eustace MooreNelson, Joelyn Lover Sue, MD  EPINEPHrine [facility](EPIPEN JR) 0.15 MG/0.3ML injection Inject into the muscle.    [provider]  HYDROcodone-acetaminophen (NORCO/VICODIN) 5-325 MG tablet Take 1-2 tablets by mouth every 6 (six) hours as needed. 01/13/19   Eustace MooreNelson, Deniah Saia Sue, MD  FLUoxetine (PROZAC) 20 MG tablet After 2 weeks of taking 20mg , if needed you can take  this with 20mg  for a total of 40mg /day 12/21/17 01/13/19  McVey, Madelaine BhatElizabeth Whitney, PA-C  levonorgestrel-ethinyl estradiol (AVIANE) 0.1-20 MG-MCG tablet Take 1 tablet by mouth daily. 12/19/17 01/13/19  McVey, Madelaine BhatElizabeth Whitney, PA-C    Family History Family History  Problem Relation Age of Onset  . Cancer Father   . Diabetes Father   . Diabetes Paternal Grandmother     Social History Social History   Tobacco Use  . Smoking status: Never Smoker  . Smokeless tobacco: Never Used  Substance Use Topics  . Alcohol use: No  . Drug use: No     Allergies   Other, Sulfur, Cat hair extract, Influenza vac typ  [flu virus vaccine], Omnicef [cefdinir], Sulfa antibiotics, and Sulfamethoxazole   Review of Systems Review of Systems  Constitutional: Negative for chills and fever.  HENT: Negative for ear pain and sore throat.   Eyes: Negative for pain and visual disturbance.  Respiratory: Negative for cough and shortness of breath.   Cardiovascular: Negative for chest pain  and palpitations.  Gastrointestinal: Positive for abdominal pain. Negative for vomiting.  Genitourinary: Positive for flank pain and frequency. Negative for dysuria and hematuria.  Musculoskeletal: Negative for arthralgias and back pain.  Skin: Negative for color change and rash.  Neurological: Negative for seizures and syncope.  All other systems reviewed and are negative.    Physical Exam Triage Vital Signs ED Triage Vitals  Enc Vitals Group     BP 01/13/19 1606 110/81     Pulse Rate 01/13/19 1606 93     Resp 01/13/19 1606 15     Temp 01/13/19 1606 99.2 F (37.3 C)     Temp Source 01/13/19 1606 Oral     SpO2 01/13/19 1606 99 %     Weight --      Height --      Head Circumference --      Peak Flow --      Pain Score 01/13/19 1603 8     Pain Loc --      Pain Edu? --      Excl. in [place]? --    No data found.  Updated Vital Signs BP 110/81 (BP Location: Right Arm)   Pulse 93   Temp 99.2 F (37.3 C)  (Oral)   Resp 15   SpO2 99%      Physical Exam Constitutional:      General: She is not in acute distress.    Appearance: She is well-developed and normal weight.  HENT:     Head: Normocephalic and atraumatic.     Mouth/Throat:     Mouth: Mucous membranes are moist.  Eyes:     Conjunctiva/sclera: Conjunctivae normal.     Pupils: Pupils are equal, round, and reactive to light.  Neck:     Musculoskeletal: Normal range of motion.  Cardiovascular:     Rate and Rhythm: Normal rate and regular rhythm.     Heart sounds: Normal heart sounds.  Pulmonary:     Effort: Pulmonary effort is normal. No respiratory distress.     Breath sounds: Normal breath sounds.  Abdominal:     General: Abdomen is flat. Bowel sounds are normal. There is no distension.     Palpations: Abdomen is soft. There is no hepatomegaly or splenomegaly.     Tenderness: There is abdominal tenderness in the left lower quadrant. There is left CVA tenderness.     Hernia: No hernia is present.     Comments: Acutely tender to tapping on left flank.  Makes patient break into a sweat and feel queasy.  Tenderness to deep palpation in the right middle and lower quadrants.  No suprapubic tenderness.  Musculoskeletal: Normal range of motion.  Skin:    General: Skin is warm and dry.  Neurological:     Mental Status: She is alert.      UC Treatments / Results  Labs (all labs ordered are listed, but only abnormal results are displayed) Labs Reviewed  POCT URINALYSIS DIP (DEVICE) - Abnormal; Notable for the following components:      Result Value   Hgb urine dipstick MODERATE (*)    Leukocytes,Ua SMALL (*)    All other components within normal limits  URINE CULTURE  POCT PREGNANCY, URINE    EKG   Radiology No results found.  Procedures Procedures (including critical care time)  Medications Ordered in UC Medications - No data to display  Initial Impression / Assessment and Plan / UC Course  I have reviewed the  triage  vital signs and the nursing notes.  Pertinent labs & imaging results that were available during my care of the patient were reviewed by me and considered in my medical decision making (see chart for details).     Patient could have kidney stone or kidney infection.  I believe the low-grade fever, leukocytes is likely a kidney infection.  We will treat her with antibiotics and pain management.  Push fluids.  Culture urine. Off work for a couple of days.  Call for any problems. Final Clinical Impressions(s) / UC Diagnoses   Final diagnoses:  Left flank pain  Acute pyelonephritis     Discharge Instructions     Home to rest Push fluids Take antibiotic 2 times a day Occasionally this antibiotic can affect tendons and ligaments.  If you develop musculoskeletal pain please stop the antibiotic and call us Take pain medication as needed.  Do not drive while on the pain medication. We have cultured her urine.  We will call you if the urine culture is positive Return here or to the ER if worse instead of better at any time.  Kidney stone evaluation needs to be done through the emergency department.   ED Prescriptions    Medication Sig Dispense Auth. Provider   ciprofloxacin (CIPRO) 500 MG tablet Take 1 tablet (500 mg total) by mouth 2 (two) times daily. 14 tablet Eustace Moore, MD   HYDROcodone-acetaminophen (NORCO/VICODIN) 5-325 MG tablet Take 1-2 tablets by mouth every 6 (six) hours as needed. 10 tablet Eustace Moore, MD     I have reviewed the PDMP during this encounter.   Eustace Moore, MD 01/13/19 1705

## 2019-01-13 NOTE — Discharge Instructions (Addendum)
Home to rest Push fluids Take antibiotic 2 times a day Occasionally this antibiotic can affect tendons and ligaments.  If you develop musculoskeletal pain please stop the antibiotic and call us Take pain medication as needed.  Do not drive while on the pain medication. We have cultured her urine.  We will call you if the urine culture is positive Return here or to the ER if worse instead of better at any time.  Kidney stone evaluation needs to be done through the emergency department.

## 2019-01-15 ENCOUNTER — Telehealth (HOSPITAL_COMMUNITY): Payer: Self-pay | Admitting: Emergency Medicine

## 2019-01-15 ENCOUNTER — Emergency Department (HOSPITAL_COMMUNITY)
Admission: EM | Admit: 2019-01-15 | Discharge: 2019-01-16 | Discharge status: Against Medical Advice | Payer: Managed Care, Other (non HMO) | Attending: Emergency Medicine | Admitting: Emergency Medicine

## 2019-01-15 ENCOUNTER — Other Ambulatory Visit: Payer: Self-pay

## 2019-01-15 ENCOUNTER — Encounter (HOSPITAL_COMMUNITY): Payer: Self-pay | Admitting: Emergency Medicine

## 2019-01-15 DIAGNOSIS — Z5321 Procedure and treatment not carried out due to patient leaving prior to being seen by health care provider: Secondary | ICD-10-CM | POA: Diagnosis not present

## 2019-01-15 DIAGNOSIS — R1032 Left lower quadrant pain: Secondary | ICD-10-CM | POA: Diagnosis present

## 2019-01-15 LAB — CBC WITH DIFFERENTIAL/PLATELET
Abs Immature Granulocytes: 0.04 10*3/uL (ref 0.00–0.07)
Basophils Absolute: 0.1 10*3/uL (ref 0.0–0.1)
Basophils Relative: 0 %
Eosinophils Absolute: 0.1 10*3/uL (ref 0.0–0.5)
Eosinophils Relative: 1 %
HCT: 40.7 % (ref 36.0–46.0)
Hemoglobin: 13.3 g/dL (ref 12.0–15.0)
Immature Granulocytes: 0 %
Lymphocytes Relative: 20 %
Lymphs Abs: 2.6 10*3/uL (ref 0.7–4.0)
MCH: 31.1 pg (ref 26.0–34.0)
MCHC: 32.7 g/dL (ref 30.0–36.0)
MCV: 95.1 fL (ref 80.0–100.0)
Monocytes Absolute: 1.1 10*3/uL — ABNORMAL HIGH (ref 0.1–1.0)
Monocytes Relative: 9 %
Neutro Abs: 9.2 10*3/uL — ABNORMAL HIGH (ref 1.7–7.7)
Neutrophils Relative %: 70 %
Platelets: 321 10*3/uL (ref 150–400)
RBC: 4.28 MIL/uL (ref 3.87–5.11)
RDW: 11.7 % (ref 11.5–15.5)
WBC: 13.1 10*3/uL — ABNORMAL HIGH (ref 4.0–10.5)
nRBC: 0 % (ref 0.0–0.2)

## 2019-01-15 LAB — COMPREHENSIVE METABOLIC PANEL
ALT: 16 U/L (ref 0–44)
AST: 16 U/L (ref 15–41)
Albumin: 3.8 g/dL (ref 3.5–5.0)
Alkaline Phosphatase: 69 U/L (ref 38–126)
Anion gap: 10 (ref 5–15)
BUN: 12 mg/dL (ref 6–20)
CO2: 23 mmol/L (ref 22–32)
Calcium: 8.9 mg/dL (ref 8.9–10.3)
Chloride: 106 mmol/L (ref 98–111)
Creatinine, Ser: 0.91 mg/dL (ref 0.44–1.00)
GFR calc Af Amer: >60 mL/min (ref >60)
GFR calc non Af Amer: >60 mL/min (ref >60)
Glucose, Bld: 106 mg/dL — ABNORMAL HIGH (ref 70–99)
Potassium: 4.5 mmol/L (ref 3.5–5.1)
Sodium: 139 mmol/L (ref 135–145)
Total Bilirubin: 0.6 mg/dL (ref 0.3–1.2)
Total Protein: 6.7 g/dL (ref 6.5–8.1)

## 2019-01-15 LAB — URINALYSIS, ROUTINE W REFLEX MICROSCOPIC
Bilirubin Urine: NEGATIVE
Glucose, UA: NEGATIVE mg/dL
Ketones, ur: NEGATIVE mg/dL
Nitrite: NEGATIVE
Protein, ur: 100 mg/dL — AB
Specific Gravity, Urine: 1.016 (ref 1.005–1.030)
WBC, UA: >50 WBC/hpf — ABNORMAL HIGH (ref 0–5)
pH: 6 (ref 5.0–8.0)

## 2019-01-15 LAB — URINE CULTURE: Culture: >=100000 — AB

## 2019-01-15 LAB — I-STAT BETA HCG BLOOD, ED (MC, WL, AP ONLY): I-stat hCG, quantitative: <5 m[IU]/mL (ref <5)

## 2019-01-15 NOTE — Telephone Encounter (Signed)
Pt called back, gave her urine culture results, will follow up if symptoms persist. All questions answered.

## 2019-01-15 NOTE — Telephone Encounter (Signed)
Urine culture was positive for e coli and was given  cipro at urgent care visit. Attempted to reach patient. No answer at this time.   

## 2019-01-15 NOTE — ED Triage Notes (Signed)
Patient reports persistent left lower flank pain onset last week with mild hematuria unrelieved by prescription pain medication/antibiotic. Denies fever or chills .

## 2019-01-16 ENCOUNTER — Ambulatory Visit: Payer: Managed Care, Other (non HMO) | Admitting: Adult Health Nurse Practitioner

## 2019-01-16 ENCOUNTER — Encounter: Payer: Self-pay | Admitting: Adult Health Nurse Practitioner

## 2019-01-16 DIAGNOSIS — N10 Acute tubulo-interstitial nephritis: Secondary | ICD-10-CM

## 2019-01-16 HISTORY — DX: Acute pyelonephritis: N10

## 2019-01-16 MED ORDER — FLUCONAZOLE 150 MG PO TABS: 150.0000 mg | ORAL_TABLET | Freq: Once | ORAL | 0 refills | Status: AC

## 2019-01-16 MED ORDER — HYDROCODONE-ACETAMINOPHEN 7.5-325 MG PO TABS: 1.0000 | ORAL_TABLET | Freq: Four times a day (QID) | ORAL | 0 refills | Status: DC | PRN

## 2019-01-16 NOTE — Patient Instructions (Signed)
° ° ° °  If you have lab work done today you will be contacted with your lab results within the next 2 weeks.  If you have not heard from us then please contact us. The fastest way to get your results is to register for My Chart. ° ° °IF you received an x-ray today, you will receive an invoice from [facility]. Please contact [facility] at [phone] with questions or concerns regarding your invoice.  ° °IF you received labwork today, you will receive an invoice from [facility]. Please contact [facility] at [phone] with questions or concerns regarding your invoice.  ° °Our billing staff will not be able to assist you with questions regarding bills from these companies. ° °You will be contacted with the lab results as soon as they are available. The fastest way to get your results is to activate your My Chart account. Instructions are located on the last page of this paperwork. If you have not heard from us regarding the results in 2 weeks, please contact this office. °  ° ° ° °

## 2019-01-16 NOTE — ED Notes (Signed)
Pt was calledx3 with no response, is presumed to have left the facility and is no longer on the premisis

## 2019-01-16 NOTE — Progress Notes (Signed)
Chief Complaint  Patient presents with  . Nausea  . Flank Pain    left side and it moves. closer to the groin area - started 01/12/19 sat evening     HPI   Patient presents with approximately 5 days of symptoms r/t pyelonephritis.  Originally diagnosed in urgent care and given cipro on Sunday.  Pain to left flank has worsened with systemic symptoms of chills, fevers, acute right sided flank pain.  Feels fatigued and ill.  Not experiencing urinary symptoms but grew E. Coli out of urine culture.  Sensitive to Cipro. Went to ER yesterday but could not stay due to number of people at ER.  She has worsened since them.    4 review of systems  Past Medical History:  Diagnosis Date  . Acute pyelonephritis 01/16/2019  . Anxiety   . Depression   . Migraine   . PCOS (polycystic ovarian syndrome)     Current Outpatient Medications  Medication Sig Dispense Refill  . ciprofloxacin (CIPRO) 500 MG tablet Take 1 tablet (500 mg total) by mouth 2 (two) times daily. 14 tablet 0  . EPINEPHrine (EPIPEN JR) 0.15 MG/0.3ML injection Inject into the muscle.    . fluconazole (DIFLUCAN) 150 MG tablet Take 1 tablet (150 mg total) by mouth once for 1 dose. 1 tablet 0  . HYDROcodone-acetaminophen (NORCO) 7.5-325 MG tablet Take 1 tablet by mouth every 6 (six) hours as needed for moderate pain. 30 tablet 0   No current facility-administered medications for this visit.     Allergies:  Allergies  Allergen Reactions  . Other Anaphylaxis, Cough and Rash    Dustmites, cats  . Sulfur Shortness Of Breath and Swelling  . Cat Hair Extract   . Influenza Vac Typ  [Flu Virus Vaccine]   . Omnicef [Cefdinir]   . Sulfa Antibiotics   . Sulfamethoxazole Swelling    Past Surgical History:  Procedure Laterality Date  . adnoidectomy and tonsilectomy    . TONSILLECTOMY AND ADENOIDECTOMY      Social History   Socioeconomic History  . Marital status: Single    Spouse name: Not on file  . Number of children: Not on  file  . Years of education: Not on file  . Highest education level: Not on file  Occupational History  . Not on file  Social Needs  . Financial resource strain: Not on file  . Food insecurity    Worry: Not on file    Inability: Not on file  . Transportation needs    Medical: Not on file    Non-medical: Not on file  Tobacco Use  . Smoking status: Never Smoker  . Smokeless tobacco: Never Used  Substance and Sexual Activity  . Alcohol use: No  . Drug use: Yes    Types: Marijuana  . Sexual activity: Yes    Birth control/protection: None    Comment: With a woman  Lifestyle  . Physical activity    Days per week: Not on file    Minutes per session: Not on file  . Stress: Not on file  Relationships  . Social Musician on phone: Not on file    Gets together: Not on file    Attends religious service: Not on file    Active member of club or organization: Not on file    Attends meetings of clubs or organizations: Not on file    Relationship status: Not on file  Other Topics Concern  . Not  on file  Social History Narrative  . Not on file    Family History  Problem Relation Age of Onset  . Cancer Father   . Diabetes Father   . Diabetes Paternal Grandmother     Review of Systems See HPI Constitution: positive for chills, fevers, night sweats, malaise.  Skin: No rash or itching Eyes: no blurry vision, no double vision GU: positive for dysuria, hematuria, flank pain  Neuro: no dizziness or headaches     Objective: Vitals:   01/16/19 1658  BP: 129/82  Pulse: (!) 114  Temp: 98.4 F (36.9 C)  TempSrc: Oral  SpO2: 96%  Weight: 165 lb 8 oz (75.1 kg)  Height: 5\' 3"  (1.6 m)    Physical Exam Constitutional:      Appearance: Normal appearance.  Cardiovascular:     Rate and Rhythm: Normal rate and regular rhythm.  Abdominal:     Palpations: Abdomen is soft.     Tenderness: There is left CVA tenderness.  Skin:    General: Skin is warm and dry.   Neurological:     Mental Status: She is alert.        Assessment and Plan Azul was seen today for nausea and flank pain.  Diagnoses and all orders for this visit:  Acute pyelonephritis  Other orders -     HYDROcodone-acetaminophen (NORCO) 7.5-325 MG tablet; Take 1 tablet by mouth every 6 (six) hours as needed for moderate pain. -     fluconazole (DIFLUCAN) 150 MG tablet; Take 1 tablet (150 mg total) by mouth once for 1 dose.  Given the worsening of her symptoms, concern for sepsis, Dr. Nolon Rod and I reviewed her medical plan and have recommended going to the ER for broad spectrum antibiotics and IV fluids.  She is inline with this plan.  Cipro extended to 10 days with Diflucan for possible yeast infection.    Glyn Ade

## 2019-01-17 ENCOUNTER — Other Ambulatory Visit: Payer: Self-pay

## 2019-01-17 ENCOUNTER — Ambulatory Visit: Payer: Managed Care, Other (non HMO) | Admitting: Adult Health Nurse Practitioner

## 2019-01-17 VITALS — BP 118/72 | HR 78 | Temp 98.8°F

## 2019-01-17 DIAGNOSIS — N10 Acute tubulo-interstitial nephritis: Secondary | ICD-10-CM

## 2019-01-17 MED ORDER — SODIUM CHLORIDE 0.9 % IV BOLUS: 1000.0000 mL | Freq: Once | INTRAVENOUS | Status: DC

## 2019-01-17 NOTE — Progress Notes (Signed)
Chief Complaint  Patient presents with  . Follow-up    pt came feeling sweating, itching from meds prescribe by the hospital and need IV fluid today    HPI  Patient went to the ER last night as advised by myself and attending, Dr. Delia Chimes.  She was given IV Levaquin.  She feels somewhat better but has not been given or had any fluids and has not urinated.  Still pain to the left flank; mildly improved.   She is here for IV fluid replacement and labs.  4 review of systems  Past Medical History:  Diagnosis Date  . Acute pyelonephritis 01/16/2019  . Anxiety   . Depression   . Migraine   . PCOS (polycystic ovarian syndrome)     Current Outpatient Medications  Medication Sig Dispense Refill  . ciprofloxacin (CIPRO) 500 MG tablet Take 1 tablet (500 mg total) by mouth 2 (two) times daily. 14 tablet 0  . EPINEPHrine (EPIPEN JR) 0.15 MG/0.3ML injection Inject into the muscle.    Marland Kitchen HYDROcodone-acetaminophen (NORCO) 7.5-325 MG tablet Take 1 tablet by mouth every 6 (six) hours as needed for moderate pain. 30 tablet 0   Current Facility-Administered Medications  Medication Dose Route Frequency Provider Last Rate Last Dose  . sodium chloride 0.9 % bolus 1,000 mL  1,000 mL Intravenous Once Wendall Mola, NP      . sodium chloride 0.9 % bolus 1,000 mL  1,000 mL Intravenous Once Wendall Mola, NP        Allergies:  Allergies  Allergen Reactions  . Other Anaphylaxis, Cough and Rash    Dustmites, cats  . Sulfur Shortness Of Breath and Swelling  . Cat Hair Extract   . Influenza Vac Typ  [Flu Virus Vaccine]   . Omnicef [Cefdinir]   . Sulfa Antibiotics   . Sulfamethoxazole Swelling    Past Surgical History:  Procedure Laterality Date  . adnoidectomy and tonsilectomy    . TONSILLECTOMY AND ADENOIDECTOMY      Social History   Socioeconomic History  . Marital status: Single    Spouse name: Not on file  . Number of children: Not on file  . Years of education: Not on  file  . Highest education level: Not on file  Occupational History  . Not on file  Social Needs  . Financial resource strain: Not on file  . Food insecurity    Worry: Not on file    Inability: Not on file  . Transportation needs    Medical: Not on file    Non-medical: Not on file  Tobacco Use  . Smoking status: Never Smoker  . Smokeless tobacco: Never Used  Substance and Sexual Activity  . Alcohol use: No  . Drug use: Yes    Types: Marijuana  . Sexual activity: Yes    Birth control/protection: None    Comment: With a woman  Lifestyle  . Physical activity    Days per week: Not on file    Minutes per session: Not on file  . Stress: Not on file  Relationships  . Social Herbalist on phone: Not on file    Gets together: Not on file    Attends religious service: Not on file    Active member of club or organization: Not on file    Attends meetings of clubs or organizations: Not on file    Relationship status: Not on file  Other Topics Concern  . Not on file  Social History Narrative  . Not on file    Family History  Problem Relation Age of Onset  . Cancer Father   . Diabetes Father   . Diabetes Paternal Grandmother    ROS Review of Systems - History obtained from the patient General ROS: positive for  - chills, fatigue and night sweats Psychological ROS: negative Respiratory ROS: no cough, shortness of breath, or wheezing negative Gastrointestinal ROS: no abdominal pain, change in bowel habits, or black or bloody stools Genito-Urinary ROS: positive for - dysuria, urinary frequency/urgency and lower left abdominal tenderness Musculoskeletal ROS: positive for - flank pain   Objective: Vitals:   01/17/19 1306  BP: 118/72  Pulse: 78  Temp: 98.8 F (37.1 C)    Physical Exam Vitals signs reviewed.  Constitutional:      Appearance: She is diaphoretic.  Neck:     Musculoskeletal: Normal range of motion and neck supple.  Cardiovascular:     Rate  and Rhythm: Normal rate and regular rhythm.     Pulses: Normal pulses.     Heart sounds: Normal heart sounds.  Pulmonary:     Effort: Pulmonary effort is normal.     Breath sounds: Normal breath sounds.  Abdominal:     Palpations: Abdomen is soft.     Tenderness: There is left CVA tenderness.  Skin:    General: Skin is warm.  Neurological:     Mental Status: She is alert.     CT scan in the ER yesterday showed some haziness around the tissue adjacent to the left kidney suggesting pyelonephritis   Assessment and Plan Suzetta was seen today for follow-up.  Diagnoses and all orders for this visit:  Acute pyelonephritis -     Comprehensive metabolic panel -     CBC with Differential/Platelet -     Insert peripheral IV -     sodium chloride 0.9 % bolus 1,000 mL -     sodium chloride 0.9 % bolus 1,000 mL -     Urine Microscopic   Will f/u once labs return.  Patient instructed to drink water--up to 3/4 and 1liter/hr.  Continue current antibiotics.  She and her mother are inline with this plan.     Elyse Jarvis

## 2019-01-18 ENCOUNTER — Encounter: Payer: Self-pay | Admitting: Adult Health Nurse Practitioner

## 2019-01-18 LAB — CBC WITH DIFFERENTIAL/PLATELET
Basophils Absolute: 0 10*3/uL (ref 0.0–0.2)
Basos: 0 %
EOS (ABSOLUTE): 0.1 10*3/uL (ref 0.0–0.4)
Eos: 1 %
Hematocrit: 39.5 % (ref 34.0–46.6)
Hemoglobin: 13.3 g/dL (ref 11.1–15.9)
Immature Grans (Abs): 0 10*3/uL (ref 0.0–0.1)
Immature Granulocytes: 0 %
Lymphocytes Absolute: 2.1 10*3/uL (ref 0.7–3.1)
Lymphs: 30 %
MCH: 31.1 pg (ref 26.6–33.0)
MCHC: 33.7 g/dL (ref 31.5–35.7)
MCV: 92 fL (ref 79–97)
Monocytes Absolute: 0.6 10*3/uL (ref 0.1–0.9)
Monocytes: 8 %
Neutrophils Absolute: 4.3 10*3/uL (ref 1.4–7.0)
Neutrophils: 61 %
Platelets: 340 10*3/uL (ref 150–450)
RBC: 4.28 x10E6/uL (ref 3.77–5.28)
RDW: 11.7 % (ref 11.7–15.4)
WBC: 7.2 10*3/uL (ref 3.4–10.8)

## 2019-01-18 LAB — COMPREHENSIVE METABOLIC PANEL
ALT: 10 IU/L (ref 0–32)
AST: 12 IU/L (ref 0–40)
Albumin/Globulin Ratio: 1.9 (ref 1.2–2.2)
Albumin: 4.4 g/dL (ref 3.9–5.0)
Alkaline Phosphatase: 81 IU/L (ref 39–117)
BUN/Creatinine Ratio: 14 (ref 9–23)
BUN: 12 mg/dL (ref 6–20)
Bilirubin Total: 0.6 mg/dL (ref 0.0–1.2)
CO2: 21 mmol/L (ref 20–29)
Calcium: 9.4 mg/dL (ref 8.7–10.2)
Chloride: 106 mmol/L (ref 96–106)
Creatinine, Ser: 0.87 mg/dL (ref 0.57–1.00)
GFR calc Af Amer: 111 mL/min/{1.73_m2} (ref >59)
GFR calc non Af Amer: 96 mL/min/{1.73_m2} (ref >59)
Globulin, Total: 2.3 g/dL (ref 1.5–4.5)
Glucose: 82 mg/dL (ref 65–99)
Potassium: 4.6 mmol/L (ref 3.5–5.2)
Sodium: 142 mmol/L (ref 134–144)
Total Protein: 6.7 g/dL (ref 6.0–8.5)

## 2019-02-01 ENCOUNTER — Ambulatory Visit: Payer: Self-pay | Admitting: Family Medicine

## 2019-02-26 ENCOUNTER — Ambulatory Visit: Payer: Managed Care, Other (non HMO) | Admitting: Certified Nurse Midwife

## 2019-10-25 ENCOUNTER — Encounter: Payer: Self-pay | Admitting: Obstetrics and Gynecology

## 2019-10-25 ENCOUNTER — Other Ambulatory Visit (HOSPITAL_COMMUNITY)
Admission: RE | Admit: 2019-10-25 | Discharge: 2019-10-25 | Disposition: A | Discharge status: Home / Self Care | Payer: Managed Care, Other (non HMO) | Source: Ambulatory Visit | Attending: Obstetrics and Gynecology | Admitting: Obstetrics and Gynecology

## 2019-10-25 ENCOUNTER — Encounter: Payer: Self-pay | Admitting: Obstetrics

## 2019-10-25 ENCOUNTER — Ambulatory Visit (INDEPENDENT_AMBULATORY_CARE_PROVIDER_SITE_OTHER): Payer: Managed Care, Other (non HMO) | Admitting: Obstetrics and Gynecology

## 2019-10-25 ENCOUNTER — Other Ambulatory Visit: Payer: Self-pay

## 2019-10-25 VITALS — BP 114/73 | HR 97 | Ht 63.0 in | Wt 151.0 lb

## 2019-10-25 DIAGNOSIS — Z30011 Encounter for initial prescription of contraceptive pills: Secondary | ICD-10-CM | POA: Diagnosis present

## 2019-10-25 DIAGNOSIS — Z01419 Encounter for gynecological examination (general) (routine) without abnormal findings: Secondary | ICD-10-CM

## 2019-10-25 DIAGNOSIS — N898 Other specified noninflammatory disorders of vagina: Secondary | ICD-10-CM | POA: Insufficient documentation

## 2019-10-25 MED ORDER — NORGESTIM-ETH ESTRAD TRIPHASIC 0.18/0.215/0.25 MG-25 MCG PO TABS: 1.0000 | ORAL_TABLET | Freq: Every day | ORAL | 11 refills | Status: DC

## 2019-10-25 NOTE — Patient Instructions (Signed)
Diet for Polycystic Ovary Syndrome Polycystic ovary syndrome (PCOS) is a disorder of the chemicals (hormones) that regulate a woman's reproductive system, including monthly periods (menstruation). The condition causes important hormones to be out of balance. PCOS can:  Stop your periods or make them irregular.  Cause cysts to develop on your ovaries.  Make it difficult to get pregnant.  Stop your body from responding to the effects of insulin (insulin resistance). Insulin resistance can lead to obesity and diabetes. Changing what you eat can help you manage PCOS and improve your health. Following a balanced diet can help you lose weight and improve the way that your body uses insulin. What are tips for following this plan?  Follow a balanced diet for meals and snacks. Eat breakfast, lunch, dinner, and one or two snacks every day.  Include protein in each meal and snack.  Choose whole grains instead of products that are made with refined flour.  Eat a variety of foods.  Exercise regularly as told by your health care provider. Aim to do 30 or more minutes of exercise on most days of the week.  If you are overweight or obese: ? Pay attention to how many calories you eat. Cutting down on calories can help you lose weight. ? Work with your health care provider or a diet and nutrition specialist (dietitian) to figure out how many calories you need each day. What foods can I eat?  Fruits Include a variety of colors and types. All fruits are helpful for PCOS. Vegetables Include a variety of colors and types. All vegetables are helpful for PCOS. Grains Whole grains, such as whole wheat. Whole-grain breads, crackers, cereals, and pasta. Unsweetened oatmeal, bulgur, barley, quinoa, and brown rice. Tortillas made from corn or whole-wheat flour. Meats and other proteins Low-fat (lean) proteins, such as fish, chicken, beans, eggs, and tofu. Dairy Low-fat dairy products, such as skim milk,  cheese sticks, and yogurt. Beverages Low-fat or fat-free drinks, such as water, low-fat milk, sugar-free drinks, and small amounts of 100% fruit juice. Seasonings and condiments Ketchup. Mustard. Barbecue sauce. Relish. Low-fat or fat-free mayonnaise. Fats and oils Olive oil or canola oil. Walnuts and almonds. The items listed above may not be a complete list of recommended foods and beverages. Contact a dietitian for more options. What foods are not recommended? Foods that are high in calories or fat. Fried foods. Sweets. Products that are made from refined white flour, including white bread, pastries, white rice, and pasta. The items listed above may not be a complete list of foods and beverages to avoid. Contact a dietitian for more information. Summary  PCOS is a hormonal imbalance that affects a woman's reproductive system.  You can help to manage your PCOS by exercising regularly and eating a healthy, varied diet of vegetables, fruit, whole grains, low-fat (lean) protein, and low-fat dairy products.  Changing what you eat can improve the way that your body uses insulin, help your hormones reach normal levels, and help you lose weight. This information is not intended to replace advice given to you by your health care provider. Make sure you discuss any questions you have with your health care provider. Document Revised: 07/11/2018 Document Reviewed: 01/23/2017 Elsevier Patient Education  2020 Elsevier Inc. Polycystic Ovarian Syndrome  Polycystic ovarian syndrome (PCOS) is a common hormonal disorder among women of reproductive age. In most women with PCOS, many small fluid-filled sacs (cysts) grow on the ovaries, and the cysts are not part of a normal menstrual cycle.   PCOS can cause problems with your menstrual periods and make it difficult to get pregnant. It can also cause an increased risk of miscarriage with pregnancy. If it is not treated, PCOS can lead to serious health problems,  such as diabetes and heart disease. What are the causes? The cause of PCOS is not known, but it may be the result of a combination of certain factors, such as:  Irregular menstrual cycle.  High levels of certain hormones (androgens).  Problems with the hormone that helps to control blood sugar (insulin resistance).  Certain genes. What increases the risk? This condition is more likely to develop in women who have a family history of PCOS. What are the signs or symptoms? Symptoms of PCOS may include:  Multiple ovarian cysts.  Infrequent periods or no periods.  Periods that are too frequent or too heavy.  Unpredictable periods.  Inability to get pregnant (infertility) because of not ovulating.  Increased growth of hair on the face, chest, stomach, back, thumbs, thighs, or toes.  Acne or oily skin. Acne may develop during adulthood, and it may not respond to treatment.  Pelvic pain.  Weight gain or obesity.  Patches of thickened and dark brown or black skin on the neck, arms, breasts, or thighs (acanthosis nigricans).  Excess hair growth on the face, chest, abdomen, or upper thighs (hirsutism). How is this diagnosed? This condition is diagnosed based on:  Your medical history.  A physical exam, including a pelvic exam. Your health care provider may look for areas of increased hair growth on your skin.  Tests, such as: ? Ultrasound. This may be used to examine the ovaries and the lining of the uterus (endometrium) for cysts. ? Blood tests. These may be used to check levels of sugar (glucose), female hormone (testosterone), and female hormones (estrogen and progesterone) in your blood. How is this treated? There is no cure for PCOS, but treatment can help to manage symptoms and prevent more health problems from developing. Treatment varies depending on:  Your symptoms.  Whether you want to have a baby or whether you need birth control (contraception). Treatment may  include nutrition and lifestyle changes along with:  Progesterone hormone to start a menstrual period.  Birth control pills to help you have regular menstrual periods.  Medicines to make you ovulate, if you want to get pregnant.  Medicine to reduce excessive hair growth.  Surgery, in severe cases. This may involve making small holes in one or both of your ovaries. This decreases the amount of testosterone that your body produces. Follow these instructions at home:  Take over-the-counter and prescription medicines only as told by your health care provider.  Follow a healthy meal plan. This can help you reduce the effects of PCOS. ? Eat a healthy diet that includes lean proteins, complex carbohydrates, fresh fruits and vegetables, low-fat dairy products, and healthy fats. Make sure to eat enough fiber.  If you are overweight, lose weight as told by your health care provider. ? Losing 10% of your body weight may improve symptoms. ? Your health care provider can determine how much weight loss is best for you and can help you lose weight safely.  Keep all follow-up visits as told by your health care provider. This is important. Contact a health care provider if:  Your symptoms do not get better with medicine.  You develop new symptoms. This information is not intended to replace advice given to you by your health care provider. Make sure you   discuss any questions you have with your health care provider. Document Revised: 03/03/2017 Document Reviewed: 09/06/2015 Elsevier Patient Education  2020 Elsevier Inc.  

## 2019-10-25 NOTE — Progress Notes (Signed)
GYNECOLOGY ANNUAL PREVENTATIVE CARE ENCOUNTER NOTE  History:     Christie Reyes is a 24 y.o. G0P0 female here for a routine annual gynecologic exam.  Current complaints: irregular menses, history of PCOS.   Denies discharge, pelvic pain, problems with intercourse or other gynecologic concerns.    Pt notes menses q 2-3 months.  Menses just started today.  She has tried metformin when she was much younger but discontinued due to GI upset.  OCP have been used in the past and also have been somewhat effective.  She has had female partners in the past, but currently has a female partner.  She is not actively looking to be pregnant.   Gynecologic History Patient's last menstrual period was 10/25/2019 (exact date). Contraception: none Last Pap: n/a. Last mammogram: n/a.  Obstetric History OB History  Gravida Para Term Preterm AB Living  0            SAB TAB Ectopic Multiple Live Births               Past Medical History:  Diagnosis Date  . Acute pyelonephritis 01/16/2019  . Anxiety   . Depression   . Migraine   . PCOS (polycystic ovarian syndrome)     Past Surgical History:  Procedure Laterality Date  . adnoidectomy and tonsilectomy    . TONSILLECTOMY AND ADENOIDECTOMY      Current Outpatient Medications on File Prior to Visit  Medication Sig Dispense Refill  . EPINEPHrine (EPIPEN JR) 0.15 MG/0.3ML injection Inject into the muscle.    . ciprofloxacin (CIPRO) 500 MG tablet Take 1 tablet (500 mg total) by mouth 2 (two) times daily. (Patient not taking: Reported on 10/25/2019) 14 tablet 0  . HYDROcodone-acetaminophen (NORCO) 7.5-325 MG tablet Take 1 tablet by mouth every 6 (six) hours as needed for moderate pain. (Patient not taking: Reported on 10/25/2019) 30 tablet 0  . [DISCONTINUED] FLUoxetine (PROZAC) 20 MG tablet After 2 weeks of taking 20mg , if needed you can take this with 20mg  for a total of 40mg /day 15 tablet 3  . [DISCONTINUED] levonorgestrel-ethinyl estradiol (AVIANE) 0.1-20  MG-MCG tablet Take 1 tablet by mouth daily. 1 Package 11   Current Facility-Administered Medications on File Prior to Visit  Medication Dose Route Frequency Provider Last Rate Last Admin  . sodium chloride 0.9 % bolus 1,000 mL  1,000 mL Intravenous Once , NP      . sodium chloride 0.9 % bolus 1,000 mL  1,000 mL Intravenous Once , NP        Allergies  Allergen Reactions  . Other Anaphylaxis, Cough and Rash    Dustmites, cats  . Sulfur Shortness Of Breath and Swelling  . Cat Hair Extract   . Influenza Vac Typ  [Flu Virus Vaccine]   . Omnicef [Cefdinir]   . Sulfa Antibiotics   . Sulfamethoxazole Swelling    Social History:  reports that she has never smoked. She has never used smokeless tobacco. She reports current drug use. Drug: Marijuana. She reports that she does not drink alcohol.  Family History  Problem Relation Age of Onset  . Cancer Father   . Diabetes Father   . Diabetes Paternal Grandmother     The following portions of the patient's history were reviewed and updated as appropriate: allergies, current medications, past family history, past medical history, past social history, past surgical history and problem list.  Review of Systems Pertinent items noted in HPI and remainder of  comprehensive ROS otherwise negative.  Physical Exam:  BP 114/73   Pulse 97   Ht 5\' 3"  (1.6 m)   Wt 151 lb (68.5 kg)   LMP 10/25/2019 (Exact Date)   BMI 26.75 kg/m  CONSTITUTIONAL: Well-developed, well-nourished female in no acute distress.  HENT:  Normocephalic, atraumatic, External right and left ear normal. Oropharynx is clear and moist EYES: Conjunctivae and EOM are normal. Pupils are equal, round, and reactive to light. No scleral icterus.  NECK: Normal range of motion, supple, no masses.  Normal thyroid.  SKIN: Skin is warm and dry. No rash noted. Not diaphoretic. No erythema. No pallor.  No abnormal hair growth noted MUSCULOSKELETAL: Normal  range of motion. No tenderness.  No cyanosis, clubbing, or edema.  2+ distal pulses. NEUROLOGIC: Alert and oriented to person, place, and time. Normal reflexes, muscle tone coordination.  PSYCHIATRIC: Normal mood and affect. Normal behavior. Normal judgment and thought content. CARDIOVASCULAR: Normal heart rate noted, regular rhythm RESPIRATORY: Clear to auscultation bilaterally. Effort and breath sounds normal, no problems with respiration noted. BREASTS: Symmetric in size. No masses, tenderness, skin changes, nipple drainage, or lymphadenopathy bilaterally. Bilateral piercings notedPerformed in the presence of a chaperone. ABDOMEN: Soft, no distention noted.  No tenderness, rebound or guarding.  PELVIC: Normal appearing external genitalia and urethral meatus; normal appearing vaginal mucosa and cervix.  No abnormal discharge noted.  Pap smear obtained.  Normal uterine size, no other palpable masses, no uterine or adnexal tenderness.  Performed in the presence of a chaperone.   Assessment and Plan:    1. Encounter for BCP (birth control pills) initial prescription Well woman exam. - Norgestimate-Ethinyl Estradiol Triphasic (ORTHO TRI-CYCLEN LO) 0.18/0.215/0.25 MG-25 MCG tab; Take 1 tablet by mouth daily.  Dispense: 28 tablet; Refill: 11 - Cytology - PAP( [place]) - Cervicovaginal ancillary only( [place]) - HIV antibody (with reflex) - RPR - Hepatitis C Antibody - Hepatitis B Surface AntiGEN  2. Vaginal discharge  - Cytology - PAP( [place]) - Cervicovaginal ancillary only( [place]) - HIV antibody (with reflex) - RPR - Hepatitis C Antibody - Hepatitis B Surface AntiGEN  3. PCOS:  Discussed with patient we can attempt to regulate cycles or try to aid in pregnancy, but not at the same time.  Pt is fine with birth control pills for now.  Will start with orthotricyclen lo this weekend. Will follow up in 3 months to see if ocp was effective. Pt advised to continue with weight  loss, possibly to 130-135 pounds, but otherwise maintain healthy weight. Can consider transitioning to metformin and clomid if patient is trying to get pregnant. Pt may need endocrinology in the future.  Will follow up results of pap smear and manage accordingly. Routine preventative health maintenance measures emphasized. Please refer to After Visit Summary for other counseling recommendations.      05-02-1979, MD, FACOG Obstetrician & Gynecologist, [facility] for [facility], [facility]

## 2019-10-25 NOTE — Progress Notes (Signed)
Pt. Request all STD screening Pt. Request Birth Control

## 2019-10-28 ENCOUNTER — Other Ambulatory Visit: Payer: Self-pay | Admitting: *Deleted

## 2019-10-28 ENCOUNTER — Other Ambulatory Visit: Payer: Managed Care, Other (non HMO)

## 2019-10-28 LAB — CERVICOVAGINAL ANCILLARY ONLY
Bacterial Vaginitis (gardnerella): POSITIVE — AB
Candida Glabrata: NEGATIVE
Candida Vaginitis: NEGATIVE
Chlamydia: NEGATIVE
Comment: NEGATIVE
Comment: NEGATIVE
Comment: NEGATIVE
Comment: NEGATIVE
Comment: NEGATIVE
Comment: NORMAL
Neisseria Gonorrhea: NEGATIVE
Trichomonas: NEGATIVE

## 2019-10-28 MED ORDER — METRONIDAZOLE 500 MG PO TABS: 500.0000 mg | ORAL_TABLET | Freq: Two times a day (BID) | ORAL | 0 refills | Status: DC

## 2019-10-28 NOTE — Progress Notes (Signed)
Flagyl sent per order on lab result

## 2019-10-29 LAB — CYTOLOGY - PAP
Comment: NEGATIVE
Diagnosis: UNDETERMINED — AB
High risk HPV: POSITIVE — AB

## 2019-10-31 ENCOUNTER — Other Ambulatory Visit: Payer: Managed Care, Other (non HMO)

## 2019-10-31 ENCOUNTER — Other Ambulatory Visit: Payer: Self-pay

## 2019-11-01 LAB — HEPATITIS C ANTIBODY: Hep C Virus Ab: <0.1 s/co ratio (ref 0.0–0.9)

## 2019-11-01 LAB — HIV ANTIBODY (ROUTINE TESTING W REFLEX): HIV Screen 4th Generation wRfx: NONREACTIVE

## 2019-11-01 LAB — RPR: RPR Ser Ql: NONREACTIVE

## 2019-11-01 LAB — HEPATITIS B SURFACE ANTIGEN: Hepatitis B Surface Ag: NEGATIVE

## 2019-11-16 ENCOUNTER — Ambulatory Visit (HOSPITAL_COMMUNITY)
Admission: EM | Admit: 2019-11-16 | Discharge: 2019-11-16 | Disposition: A | Discharge status: Home / Self Care | Payer: Managed Care, Other (non HMO) | Attending: Family Medicine | Admitting: Family Medicine

## 2019-11-16 ENCOUNTER — Other Ambulatory Visit: Payer: Self-pay

## 2019-11-16 ENCOUNTER — Encounter (HOSPITAL_COMMUNITY): Payer: Self-pay

## 2019-11-16 DIAGNOSIS — B029 Zoster without complications: Secondary | ICD-10-CM

## 2019-11-16 MED ORDER — VALACYCLOVIR HCL 1 G PO TABS: 1000.0000 mg | ORAL_TABLET | Freq: Three times a day (TID) | ORAL | 0 refills | Status: DC

## 2019-11-16 NOTE — ED Triage Notes (Signed)
Pt reports  Spider bit her in the left thigh 2 days ago. Is painful to touch.

## 2019-11-18 NOTE — ED Provider Notes (Signed)
[facility]   [id number] 11/16/19 Arrival Time: 1742  ASSESSMENT & PLAN:  1. Herpes zoster without complication     Begin: Meds ordered this encounter  Medications  . valACYclovir (VALTREX) 1000 MG tablet    Sig: Take 1 tablet (1,000 mg total) by mouth 3 (three) times daily.    Dispense:  21 tablet    Refill:  0   OTC analgesics if necessary.  Will follow up with PCP or here if worsening or failing to improve as anticipated. Reviewed expectations re: course of current medical issues. Questions answered. Outlined signs and symptoms indicating need for more acute intervention. Patient verbalized understanding. After Visit Summary given.   SUBJECTIVE:  Christie Reyes is a 24 y.o. female who presents with a skin complaint. Left lateral thigh. Questions spider bite. "Red area" x 2 d. Slightly painful. Afebrile. No bleeding or drainage. No OTC tx.     OBJECTIVE: Vitals:   11/16/19 1816  BP: 117/76  Pulse: 76  Resp: 16  Temp: 98.2 F (36.8 C)  TempSrc: Oral  SpO2: 98%    General appearance: alert; no distress HEENT: [place]; AT Neck: supple with FROM Lungs: clear to auscultation bilaterally Heart: regular rate and rhythm Extremities: no edema; moves all extremities normally Skin: warm and dry; no signs of infection; single crop of red/purplish papules over left lateral thigh Psychological: alert and cooperative; normal mood and affect  Allergies  Allergen Reactions  . Cefdinir Anaphylaxis  . Other Anaphylaxis, Cough and Rash    Dustmites, cats  . Sulfur Shortness Of Breath and Swelling  . Cat Hair Extract   . Influenza Vac Typ  [Flu Virus Vaccine]   . Sulfa Antibiotics   . Sulfamethoxazole Swelling    Past Medical History:  Diagnosis Date  . Acute pyelonephritis 01/16/2019  . Anxiety   . Depression   . Migraine   . PCOS (polycystic ovarian syndrome)    Social History   Socioeconomic History  . Marital status: Single    Spouse name: Not on  file  . Number of children: Not on file  . Years of education: Not on file  . Highest education level: Not on file  Occupational History  . Not on file  Tobacco Use  . Smoking status: Never Smoker  . Smokeless tobacco: Never Used  Substance and Sexual Activity  . Alcohol use: No  . Drug use: Yes    Types: Marijuana  . Sexual activity: Yes    Birth control/protection: None    Comment: With a woman  Other Topics Concern  . Not on file  Social History Narrative  . Not on file   Social Determinants of Health   Financial Resource Strain:   . Difficulty of Paying Living Expenses:   Food Insecurity:   . Worried About Programme researcher, broadcasting/film/video in the Last Year:   . Barista in the Last Year:   Transportation Needs:   . Freight forwarder (Medical):   Marland Kitchen Lack of Transportation (Non-Medical):   Physical Activity:   . Days of Exercise per Week:   . Minutes of Exercise per Session:   Stress:   . Feeling of Stress :   Social Connections:   . Frequency of Communication with Friends and Family:   . Frequency of Social Gatherings with Friends and Family:   . Attends Religious Services:   . Active Member of Clubs or Organizations:   . Attends Banker Meetings:   .  Marital Status:   Intimate Partner Violence:   . Fear of Current or Ex-Partner:   . Emotionally Abused:   Marland Kitchen Physically Abused:   . Sexually Abused:    Family History  Problem Relation Age of Onset  . Cancer Father   . Diabetes Father   . Diabetes Paternal Grandmother    Past Surgical History:  Procedure Laterality Date  . adnoidectomy and tonsilectomy    . TONSILLECTOMY AND ADENOIDECTOMY       Mardella Layman, MD 11/18/19 [phone]

## 2019-11-20 ENCOUNTER — Encounter: Payer: Self-pay | Admitting: Obstetrics and Gynecology

## 2019-11-20 ENCOUNTER — Other Ambulatory Visit (HOSPITAL_COMMUNITY)
Admission: RE | Admit: 2019-11-20 | Discharge: 2019-11-20 | Disposition: A | Discharge status: Home / Self Care | Payer: Managed Care, Other (non HMO) | Source: Ambulatory Visit | Attending: Obstetrics and Gynecology | Admitting: Obstetrics and Gynecology

## 2019-11-20 ENCOUNTER — Ambulatory Visit: Payer: Managed Care, Other (non HMO) | Admitting: Obstetrics and Gynecology

## 2019-11-20 ENCOUNTER — Other Ambulatory Visit: Payer: Self-pay

## 2019-11-20 VITALS — BP 111/75 | HR 79 | Wt 151.0 lb

## 2019-11-20 DIAGNOSIS — R87610 Atypical squamous cells of undetermined significance on cytologic smear of cervix (ASC-US): Secondary | ICD-10-CM

## 2019-11-20 DIAGNOSIS — Z01812 Encounter for preprocedural laboratory examination: Secondary | ICD-10-CM

## 2019-11-20 DIAGNOSIS — Z23 Encounter for immunization: Secondary | ICD-10-CM | POA: Diagnosis not present

## 2019-11-20 DIAGNOSIS — R87810 Cervical high risk human papillomavirus (HPV) DNA test positive: Secondary | ICD-10-CM

## 2019-11-20 HISTORY — DX: Atypical squamous cells of undetermined significance on cytologic smear of cervix (ASC-US): R87.610

## 2019-11-20 LAB — POCT URINE PREGNANCY: Preg Test, Ur: NEGATIVE

## 2019-11-20 NOTE — Progress Notes (Signed)
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  Received 1st Gardasil in 2017. She needs 2nd shot.  Injection given LD without difficulty

## 2019-11-20 NOTE — Patient Instructions (Signed)
Colposcopy, Care After This sheet gives you information about how to care for yourself after your procedure. Your doctor may also give you more specific instructions. If you have problems or questions, contact your doctor. What can I expect after the procedure? If you did not have a tissue sample removed (did not have a biopsy), you may only have some spotting for a few days. You can go back to your normal activities. If you had a tissue sample removed, it is common to have:  Soreness and pain. This may last for a few days.  Light-headedness.  Mild bleeding from your vagina or dark-colored, grainy discharge from your vagina. This may last for a few days. You may need to wear a sanitary pad.  Spotting for at least 48 hours after the procedure. Follow these instructions at home:   Take over-the-counter and prescription medicines only as told by your doctor. Ask your doctor what medicines you can start taking again. This is very important if you take blood-thinning medicine.  Do not drive or use heavy machinery while taking prescription pain medicine.  For 3 days, or as long as your doctor tells you, avoid: ? Douching. ? Using tampons. ? Having sex.  If you use birth control (contraception), keep using it.  Limit activity for the first day after the procedure. Ask your doctor what activities are safe for you.  It is up to you to get the results of your procedure. Ask your doctor when your results will be ready.  Keep all follow-up visits as told by your doctor. This is important. Contact a doctor if:  You get a skin rash. Get help right away if:  You are bleeding a lot from your vagina. It is a lot of bleeding if you are using more than one pad an hour for 2 hours in a row.  You have clumps of blood (blood clots) coming from your vagina.  You have a fever.  You have chills  You have pain in your lower belly (pelvic area).  You have signs of infection, such as vaginal  discharge that is: ? Different than usual. ? Yellow. ? Bad-smelling.  You have very pain or cramps in your lower belly that do not get better with medicine.  You feel light-headed.  You feel dizzy.  You pass out (faint). Summary  If you did not have a tissue sample removed (did not have a biopsy), you may only have some spotting for a few days. You can go back to your normal activities.  If you had a tissue sample removed, it is common to have mild pain and spotting for 48 hours.  For 3 days, or as long as your doctor tells you, avoid douching, using tampons and having sex.  Get help right away if you have bleeding, very bad pain, or signs of infection. This information is not intended to replace advice given to you by your health care provider. Make sure you discuss any questions you have with your health care provider. Document Revised: 03/03/2017 Document Reviewed: 12/09/2015 Elsevier Patient Education  2020 Elsevier Inc.  

## 2019-11-20 NOTE — Progress Notes (Signed)
Patient given informed consent, signed copy in the chart, time out was performed.  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  Discussed procedure in detail with patient.  Placed in lithotomy position. Cervix viewed with speculum and colposcope after application of acetic acid.   Colposcopy adequate?  no Acetowhite lesions? no Punctation? no Mosaicism?  no Abnormal vasculature?  no Biopsies? no ECC? Yes  No acetowhite lesions noted ECC taken to eval the endocervix.  Of note pt received her last dose of gardisil today as well  COMMENTS:  Patient was given post procedure instructions.  She will receive results through mychart  Warden Fillers, MD

## 2019-11-21 LAB — SURGICAL PATHOLOGY

## 2020-01-06 ENCOUNTER — Encounter (HOSPITAL_COMMUNITY): Payer: Self-pay | Admitting: Emergency Medicine

## 2020-01-06 ENCOUNTER — Emergency Department (HOSPITAL_COMMUNITY)
Admission: EM | Admit: 2020-01-06 | Discharge: 2020-01-06 | Disposition: A | Discharge status: Home / Self Care | Payer: Managed Care, Other (non HMO) | Attending: Emergency Medicine | Admitting: Emergency Medicine

## 2020-01-06 DIAGNOSIS — Y92410 Unspecified street and highway as the place of occurrence of the external cause: Secondary | ICD-10-CM | POA: Insufficient documentation

## 2020-01-06 DIAGNOSIS — Z5321 Procedure and treatment not carried out due to patient leaving prior to being seen by health care provider: Secondary | ICD-10-CM | POA: Insufficient documentation

## 2020-01-06 DIAGNOSIS — R079 Chest pain, unspecified: Secondary | ICD-10-CM | POA: Insufficient documentation

## 2020-01-06 DIAGNOSIS — M79605 Pain in left leg: Secondary | ICD-10-CM | POA: Insufficient documentation

## 2020-01-06 DIAGNOSIS — M79604 Pain in right leg: Secondary | ICD-10-CM | POA: Insufficient documentation
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
## 2020-01-06 NOTE — ED Notes (Signed)
Pt was called several times for vital recheck and got no answer. Pt moved to OTF

## 2020-01-07 ENCOUNTER — Ambulatory Visit (INDEPENDENT_AMBULATORY_CARE_PROVIDER_SITE_OTHER): Payer: Self-pay

## 2020-01-07 ENCOUNTER — Ambulatory Visit (HOSPITAL_COMMUNITY)
Admission: EM | Admit: 2020-01-07 | Discharge: 2020-01-07 | Disposition: A | Discharge status: Home / Self Care | Payer: Self-pay | Attending: Family Medicine | Admitting: Family Medicine

## 2020-01-07 ENCOUNTER — Encounter (HOSPITAL_COMMUNITY): Payer: Self-pay

## 2020-01-07 ENCOUNTER — Other Ambulatory Visit: Payer: Self-pay

## 2020-01-07 DIAGNOSIS — M7918 Myalgia, other site: Secondary | ICD-10-CM

## 2020-01-07 DIAGNOSIS — R079 Chest pain, unspecified: Secondary | ICD-10-CM

## 2020-01-07 MED ORDER — CYCLOBENZAPRINE HCL 5 MG PO TABS: 5.0000 mg | ORAL_TABLET | Freq: Three times a day (TID) | ORAL | 0 refills | Status: DC | PRN

## 2020-01-07 MED ORDER — IBUPROFEN 600 MG PO TABS: 600.0000 mg | ORAL_TABLET | Freq: Three times a day (TID) | ORAL | 0 refills | Status: DC | PRN
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
## 2020-01-07 NOTE — Discharge Instructions (Addendum)
Your x ray was normal Most likely bad muscle strain soreness Ibuprofen  for pain/inflammation Flexeril as needed for muscle relaxant.  Rest, alternate heat/ice Follow up as needed for continued or worsening symptoms
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
## 2020-01-25 ENCOUNTER — Ambulatory Visit (HOSPITAL_COMMUNITY): Payer: Self-pay

## 2020-01-30 ENCOUNTER — Ambulatory Visit (HOSPITAL_COMMUNITY): Payer: Self-pay

## 2020-01-31 ENCOUNTER — Ambulatory Visit (HOSPITAL_COMMUNITY): Payer: Self-pay

## 2020-06-17 ENCOUNTER — Ambulatory Visit: Payer: BC Managed Care – PPO

## 2021-01-28 ENCOUNTER — Ambulatory Visit: Payer: BC Managed Care – PPO

## 2021-02-06 ENCOUNTER — Other Ambulatory Visit: Payer: Self-pay

## 2021-02-06 ENCOUNTER — Encounter (HOSPITAL_COMMUNITY): Payer: Self-pay | Admitting: Obstetrics and Gynecology

## 2021-02-06 ENCOUNTER — Inpatient Hospital Stay (HOSPITAL_COMMUNITY)
Admission: AD | Admit: 2021-02-06 | Discharge: 2021-02-06 | Disposition: A | Discharge status: Home / Self Care | Payer: Medicaid Other | Attending: Obstetrics and Gynecology | Admitting: Obstetrics and Gynecology

## 2021-02-06 ENCOUNTER — Inpatient Hospital Stay (HOSPITAL_COMMUNITY): Payer: Medicaid Other

## 2021-02-06 DIAGNOSIS — A5901 Trichomonal vulvovaginitis: Secondary | ICD-10-CM

## 2021-02-06 DIAGNOSIS — M25519 Pain in unspecified shoulder: Secondary | ICD-10-CM | POA: Insufficient documentation

## 2021-02-06 DIAGNOSIS — O4691 Antepartum hemorrhage, unspecified, first trimester: Secondary | ICD-10-CM

## 2021-02-06 DIAGNOSIS — Z3A01 Less than 8 weeks gestation of pregnancy: Secondary | ICD-10-CM | POA: Diagnosis not present

## 2021-02-06 DIAGNOSIS — R102 Pelvic and perineal pain: Secondary | ICD-10-CM | POA: Insufficient documentation

## 2021-02-06 DIAGNOSIS — Z6790 Unspecified blood type, Rh positive: Secondary | ICD-10-CM

## 2021-02-06 DIAGNOSIS — O210 Mild hyperemesis gravidarum: Secondary | ICD-10-CM | POA: Insufficient documentation

## 2021-02-06 DIAGNOSIS — Z3491 Encounter for supervision of normal pregnancy, unspecified, first trimester: Secondary | ICD-10-CM

## 2021-02-06 DIAGNOSIS — O4690 Antepartum hemorrhage, unspecified, unspecified trimester: Secondary | ICD-10-CM

## 2021-02-06 DIAGNOSIS — O209 Hemorrhage in early pregnancy, unspecified: Secondary | ICD-10-CM | POA: Diagnosis not present

## 2021-02-06 DIAGNOSIS — O23591 Infection of other part of genital tract in pregnancy, first trimester: Secondary | ICD-10-CM | POA: Insufficient documentation

## 2021-02-06 LAB — COMPREHENSIVE METABOLIC PANEL
ALT: 18 U/L (ref 0–44)
AST: 14 U/L — ABNORMAL LOW (ref 15–41)
Albumin: 4 g/dL (ref 3.5–5.0)
Alkaline Phosphatase: 52 U/L (ref 38–126)
Anion gap: 6 (ref 5–15)
BUN: 10 mg/dL (ref 6–20)
CO2: 24 mmol/L (ref 22–32)
Calcium: 9 mg/dL (ref 8.9–10.3)
Chloride: 105 mmol/L (ref 98–111)
Creatinine, Ser: 0.75 mg/dL (ref 0.44–1.00)
GFR, Estimated: >60 mL/min (ref >60)
Glucose, Bld: 89 mg/dL (ref 70–99)
Potassium: 4.4 mmol/L (ref 3.5–5.1)
Sodium: 135 mmol/L (ref 135–145)
Total Bilirubin: 0.3 mg/dL (ref 0.3–1.2)
Total Protein: 6.4 g/dL — ABNORMAL LOW (ref 6.5–8.1)

## 2021-02-06 LAB — URINALYSIS, ROUTINE W REFLEX MICROSCOPIC
Bilirubin Urine: NEGATIVE
Glucose, UA: NEGATIVE mg/dL
Hgb urine dipstick: NEGATIVE
Ketones, ur: NEGATIVE mg/dL
Leukocytes,Ua: NEGATIVE
Nitrite: NEGATIVE
Protein, ur: NEGATIVE mg/dL
Specific Gravity, Urine: 1.024 (ref 1.005–1.030)
pH: 7 (ref 5.0–8.0)

## 2021-02-06 LAB — WET PREP, GENITAL
Clue Cells Wet Prep HPF POC: NONE SEEN
Sperm: NONE SEEN
WBC, Wet Prep HPF POC: >=10 — AB
Yeast Wet Prep HPF POC: NONE SEEN

## 2021-02-06 LAB — ABO/RH: ABO/RH(D): O POS

## 2021-02-06 LAB — HCG, QUANTITATIVE, PREGNANCY: hCG, Beta Chain, Quant, S: 30902 m[IU]/mL — ABNORMAL HIGH (ref <5)

## 2021-02-06 LAB — CBC
HCT: 38 % (ref 36.0–46.0)
Hemoglobin: 13.1 g/dL (ref 12.0–15.0)
MCH: 31.9 pg (ref 26.0–34.0)
MCHC: 34.5 g/dL (ref 30.0–36.0)
MCV: 92.5 fL (ref 80.0–100.0)
Platelets: 304 10*3/uL (ref 150–400)
RBC: 4.11 MIL/uL (ref 3.87–5.11)
RDW: 11.4 % — ABNORMAL LOW (ref 11.5–15.5)
WBC: 11.3 10*3/uL — ABNORMAL HIGH (ref 4.0–10.5)
nRBC: 0 % (ref 0.0–0.2)

## 2021-02-06 LAB — LIPASE, BLOOD: Lipase: 26 U/L (ref 11–51)

## 2021-02-06 MED ORDER — METRONIDAZOLE 500 MG PO TABS
2000.0000 mg | ORAL_TABLET | Freq: Once | ORAL | Status: AC
  Administered 2021-02-06: 2000 mg via ORAL
  Filled 2021-02-06: qty 4

## 2021-02-06 MED ORDER — PROMETHAZINE HCL 25 MG PO TABS: 12.5000 mg | ORAL_TABLET | Freq: Four times a day (QID) | ORAL | 1 refills | Status: DC | PRN

## 2021-02-06 MED ORDER — PROMETHAZINE HCL 25 MG PO TABS
25.0000 mg | ORAL_TABLET | Freq: Four times a day (QID) | ORAL | Status: DC | PRN
  Administered 2021-02-06: 25 mg via ORAL
  Filled 2021-02-06: qty 1

## 2021-02-06 MED ORDER — SCOPOLAMINE 1 MG/3DAYS TD PT72
1.0000 | MEDICATED_PATCH | TRANSDERMAL | Status: DC
  Administered 2021-02-06: 1.5 mg via TRANSDERMAL
  Filled 2021-02-06: qty 1

## 2021-02-06 MED ORDER — SCOPOLAMINE 1 MG/3DAYS TD PT72: 1.0000 | MEDICATED_PATCH | TRANSDERMAL | 1 refills | Status: DC

## 2021-02-06 NOTE — Discharge Instructions (Signed)

## 2021-02-06 NOTE — MAU Note (Signed)
Presents with c/o pelvic and right shoulder pain that began 3-4 weeks ago.  Denies current VB, but reports VB x1 week, but stopped yesterday.  Also c/o N/V, states unable to keep anything down.  Unsure LMP, irregular cycles d/t PCOS.

## 2021-02-06 NOTE — MAU Provider Note (Signed)
History     CSN: [id number]  Arrival date and time: 02/06/21 1425   Event Date/Time   First Provider Initiated Contact with Patient 02/06/21 1541      Chief Complaint  Patient presents with   Pelvic Pain   Shoulder Pain   Emesis   Nausea   25 y.o. G1 @unknown  gestation presenting with LAP, VB, N/V, and shoulder pain. LAP started 2 weeks ago but is becoming worse. Describes as intermittent and cramping in center of her lower abdomen. States no BM in 4 days. Rates pain 10/10. Has not treated it. Denies fever. Reports 4 days of dark red VB. Has not needed a pad, is only spotting today. N/V started 2 weeks ago but she has not been able to keep food down for the last 5 days. Had emesis x3 today. Admit to MJ use several times a day. Reports onset of right shoulder pain about 3-4 weeks ago. Denies injury, falls, or heavy lifting. Reports injury when she was much younger.    OB History     Gravida  1   Para      Term      Preterm      AB      Living         SAB      IAB      Ectopic      Multiple      Live Births              Past Medical History:  Diagnosis Date   Acute pyelonephritis 01/16/2019   Anxiety    Depression    Migraine    PCOS (polycystic ovarian syndrome)     Past Surgical History:  Procedure Laterality Date   adnoidectomy and tonsilectomy     TONSILLECTOMY AND ADENOIDECTOMY      Family History  Problem Relation Age of Onset   Diabetes Mother    Cancer Father    Diabetes Father    Diabetes Paternal Grandmother     Social History   Tobacco Use   Smoking status: Never   Smokeless tobacco: Never  Vaping Use   Vaping Use: Never used  Substance Use Topics   Alcohol use: No   Drug use: Yes    Types: Marijuana    Comment: Last smoked 02/04/2021    Allergies:  Allergies  Allergen Reactions   Cefdinir Anaphylaxis   Elemental Sulfur Shortness Of Breath and Swelling   Other Anaphylaxis, Cough and Rash    Dustmites, cats   Cat  Hair Extract    Influenza Vac Typ  [Influenza Virus Vaccine]    Sulfa Antibiotics    Sulfamethoxazole Swelling    Facility-Administered Medications Prior to Admission  Medication Dose Route Frequency Provider Last Rate Last Admin   sodium chloride 0.9 % bolus 1,000 mL  1,000 mL Intravenous Once Wendall Mola, NP       sodium chloride 0.9 % bolus 1,000 mL  1,000 mL Intravenous Once Wendall Mola, NP       Medications Prior to Admission  Medication Sig Dispense Refill Last Dose   cyclobenzaprine (FLEXERIL) 5 MG tablet Take 1 tablet (5 mg total) by mouth 3 (three) times daily as needed for muscle spasms. 30 tablet 0    EPINEPHrine (EPIPEN JR) 0.15 MG/0.3ML injection Inject into the muscle.   More than a month   ibuprofen (ADVIL) 600 MG tablet Take 1 tablet (600 mg total) by mouth every 8 (eight) hours  as needed for moderate pain. 30 tablet 0    Norgestimate-Ethinyl Estradiol Triphasic (ORTHO TRI-CYCLEN LO) 0.18/0.215/0.25 MG-25 MCG tab Take 1 tablet by mouth daily. 28 tablet 11    valACYclovir (VALTREX) 1000 MG tablet Take 1 tablet (1,000 mg total) by mouth 3 (three) times daily. 21 tablet 0 More than a month    Review of Systems  Constitutional:  Positive for chills. Negative for fever.  Gastrointestinal:  Positive for abdominal pain, constipation, nausea and vomiting.  Genitourinary:  Positive for urgency and vaginal bleeding. Negative for dysuria, frequency, hematuria and vaginal discharge.  Physical Exam   Blood pressure 116/69, pulse 82, temperature 98.2 F (36.8 C), temperature source Oral, resp. rate 20, height 5\' 3"  (1.6 m), weight 73.7 kg, SpO2 100 %.  Physical Exam Vitals and nursing note reviewed. Exam conducted with a chaperone present.  Constitutional:      Appearance: Normal appearance.  HENT:     Head: Normocephalic and atraumatic.  Cardiovascular:     Rate and Rhythm: Normal rate.  Pulmonary:     Effort: Pulmonary effort is normal. No respiratory  distress.  Abdominal:     General: There is no distension.     Palpations: Abdomen is soft. There is no mass.     Tenderness: There is abdominal tenderness in the epigastric area. There is no guarding or rebound.     Hernia: No hernia is present.  Genitourinary:    Comments: External: no lesions or erythema Uterus: non enlarged, anteverted, non tender, no CMT Adnexae: no masses, no tenderness left, no tenderness right Cervix closed   Musculoskeletal:        General: Normal range of motion.     Cervical back: Normal range of motion.  Skin:    General: Skin is warm and dry.  Neurological:     General: No focal deficit present.     Mental Status: She is alert and oriented to person, place, and time.  Psychiatric:        Mood and Affect: Mood is anxious.        Behavior: Behavior normal.   Results for orders placed or performed during the hospital encounter of 02/06/21 (from the past 24 hour(s))  Wet prep, genital     Status: Abnormal   Collection Time: 02/06/21  3:24 PM   Specimen: PATH Cytology Cervicovaginal Ancillary Only  Result Value Ref Range   Yeast Wet Prep HPF POC NONE SEEN NONE SEEN   Trich, Wet Prep PRESENT (A) NONE SEEN   Clue Cells Wet Prep HPF POC NONE SEEN NONE SEEN   WBC, Wet Prep HPF POC >=10 (A) NONE SEEN   Sperm NONE SEEN   Urinalysis, Routine w reflex microscopic PATH Cytology Cervicovaginal Ancillary Only     Status: Abnormal   Collection Time: 02/06/21  3:24 PM  Result Value Ref Range   Color, Urine YELLOW YELLOW   APPearance HAZY (A) CLEAR   Specific Gravity, Urine 1.024 1.005 - 1.030   pH 7.0 5.0 - 8.0   Glucose, UA NEGATIVE NEGATIVE mg/dL   Hgb urine dipstick NEGATIVE NEGATIVE   Bilirubin Urine NEGATIVE NEGATIVE   Ketones, ur NEGATIVE NEGATIVE mg/dL   Protein, ur NEGATIVE NEGATIVE mg/dL   Nitrite NEGATIVE NEGATIVE   Leukocytes,Ua NEGATIVE NEGATIVE  Comprehensive metabolic panel     Status: Abnormal   Collection Time: 02/06/21  4:45 PM  Result  Value Ref Range   Sodium 135 135 - 145 mmol/L   Potassium 4.4 3.5 -  5.1 mmol/L   Chloride 105 98 - 111 mmol/L   CO2 24 22 - 32 mmol/L   Glucose, Bld 89 70 - 99 mg/dL   BUN 10 6 - 20 mg/dL   Creatinine, Ser 0.75 0.44 - 1.00 mg/dL   Calcium 9.0 8.9 - 10.3 mg/dL   Total Protein 6.4 (L) 6.5 - 8.1 g/dL   Albumin 4.0 3.5 - 5.0 g/dL   AST 14 (L) 15 - 41 U/L   ALT 18 0 - 44 U/L   Alkaline Phosphatase 52 38 - 126 U/L   Total Bilirubin 0.3 0.3 - 1.2 mg/dL   GFR, Estimated >60 >60 mL/min   Anion gap 6 5 - 15  CBC     Status: Abnormal   Collection Time: 02/06/21  4:45 PM  Result Value Ref Range   WBC 11.3 (H) 4.0 - 10.5 K/uL   RBC 4.11 3.87 - 5.11 MIL/uL   Hemoglobin 13.1 12.0 - 15.0 g/dL   HCT 38.0 36.0 - 46.0 %   MCV 92.5 80.0 - 100.0 fL   MCH 31.9 26.0 - 34.0 pg   MCHC 34.5 30.0 - 36.0 g/dL   RDW 11.4 (L) 11.5 - 15.5 %   Platelets 304 150 - 400 K/uL   nRBC 0.0 0.0 - 0.2 %  Lipase, blood     Status: None   Collection Time: 02/06/21  4:45 PM  Result Value Ref Range   Lipase 26 11 - 51 U/L  ABO/Rh     Status: None   Collection Time: 02/06/21  4:45 PM  Result Value Ref Range   ABO/RH(D) O POS    No rh immune globuloin      NOT A RH IMMUNE GLOBULIN CANDIDATE, PT RH POSITIVE Performed at [facility], [address] [place], [place] [address]     US OB LESS THAN 14 WEEKS WITH OB TRANSVAGINAL  Result Date: 02/06/2021 CLINICAL DATA:  Early pregnancy, vaginal bleeding EXAM: OBSTETRIC <14 WK [place] AND TRANSVAGINAL OB US TECHNIQUE: Both transabdominal and transvaginal ultrasound examinations were performed for complete evaluation of the gestation as well as the maternal uterus, adnexal regions, and pelvic cul-de-sac. Transvaginal technique was performed to assess early pregnancy. COMPARISON:  None. FINDINGS: Intrauterine gestational sac: Present Yolk sac:  Present Embryo:  Present Cardiac Activity: Present Heart Rate: Average of 112 bpm MSD: 13.7 mm   6 w   1 d CRL:  2.6 mm   5 w   5 d                   [place] EDC: 10/04/2021 Subchorionic hemorrhage:  None visualized. Maternal uterus/adnexae: The ovaries appear unremarkable. No free pelvic fluid. IMPRESSION: 1. Single living intrauterine pregnancy measuring at 5 weeks 5 days by crown-rump length. No specific complicating features are currently identified. Electronically Signed   By: Van Clines M.D.   On: 02/06/2021 18:02    MAU Course  Procedures Scopolamine Phenergan Flagyl  MDM Review of chart shows [place] 2 days ago with 6w IUGS and qhcg of 12k.  Labs and [place] ordered and reviewed. Viable IUP on [place] and +trich, pt informed, also recommend partner get treatment. Pt tearful and states that they aren't together and she is unsure what her plans for the pregnancy are. Reports hx of depression and requesting to speak to a counselor. Denies SI/HI. Plan for outpt IBH referral. Recommend cessation of MJ during pregnancy, pt understands not safe. Nausea improved, no emesis, tolerating po. Stable  for discharge.   Assessment and Plan   1. Vaginal bleeding in pregnancy   2. Normal intrauterine pregnancy on prenatal ultrasound in first trimester   3. Blood type, Rh positive   4. Trichomonal vaginitis   5. Morning sickness   6. [redacted] weeks gestation of pregnancy    Discharge home Follow up at [facility] to start care Referral for IBH in 1-2 wks Rx Scopolamine Rx Phenergan Return precautions  Allergies as of 02/06/2021       Reactions   Cefdinir Anaphylaxis   Elemental Sulfur Shortness Of Breath, Swelling   Other Anaphylaxis, Cough, Rash   Dustmites, cats   Cat Hair Extract    Influenza Vac Typ  [influenza Virus Vaccine]    Sulfa Antibiotics    Sulfamethoxazole Swelling        Medication List     STOP taking these medications    cyclobenzaprine 5 MG tablet Commonly known as: FLEXERIL   ibuprofen 600 MG tablet Commonly known as: ADVIL   Norgestimate-Ethinyl Estradiol Triphasic 0.18/0.215/0.25 MG-25 MCG tab Commonly known  as: Ortho Tri-Cyclen Lo   valACYclovir 1000 MG tablet Commonly known as: VALTREX       TAKE these medications    EPINEPHrine 0.15 MG/0.3ML injection Commonly known as: EPIPEN JR Inject into the muscle.   promethazine 25 MG tablet Commonly known as: PHENERGAN Take 0.5-1 tablets (12.5-25 mg total) by mouth every 6 (six) hours as needed for nausea or vomiting.   scopolamine 1 MG/3DAYS Commonly known as: TRANSDERM-SCOP Place 1 patch (1.5 mg total) onto the skin every 3 (three) days. Start taking on: February 09, 2021         Donette Larry, [place] 02/06/2021, 6:43 PM

## 2021-02-08 LAB — GC/CHLAMYDIA PROBE AMP (~~LOC~~) NOT AT ARMC
Chlamydia: NEGATIVE
Comment: NEGATIVE
Comment: NORMAL
Neisseria Gonorrhea: NEGATIVE

## 2021-03-01 ENCOUNTER — Telehealth: Payer: Self-pay

## 2021-03-01 NOTE — Telephone Encounter (Signed)
Patient is scheduled for a virtual NOB intake tomorrow but she requests to be seen in person. Patient is not experiencing any sx's except mild cramping. Explained to patient this visit will be an interview not an examination. The nurse will ask several questions regarding her history etc so she does not need to come in for this visit. Informed patient after tomorrow's visit, she may request visits to be in person not virtual.

## 2021-03-02 ENCOUNTER — Encounter: Payer: Self-pay | Admitting: *Deleted

## 2021-03-02 ENCOUNTER — Ambulatory Visit: Payer: Self-pay | Admitting: *Deleted

## 2021-03-02 DIAGNOSIS — Z3480 Encounter for supervision of other normal pregnancy, unspecified trimester: Secondary | ICD-10-CM | POA: Insufficient documentation

## 2021-03-02 DIAGNOSIS — Z3400 Encounter for supervision of normal first pregnancy, unspecified trimester: Secondary | ICD-10-CM

## 2021-03-02 MED ORDER — GOJJI WEIGHT SCALE MISC: 1.0000 | 0 refills | Status: DC

## 2021-03-02 MED ORDER — BLOOD PRESSURE KIT DEVI: 1.0000 | 0 refills | Status: DC

## 2021-03-02 MED ORDER — PREPLUS 27-1 MG PO TABS: 1.0000 | ORAL_TABLET | Freq: Every day | ORAL | 13 refills | Status: DC

## 2021-03-02 NOTE — Progress Notes (Signed)
[facility] has a partnership with the [employer] to provide prenatal navigation for the most needed resources in our community. In order to see how we can help connect you to these resources we need consent to contact you. Please complete the very short consent using the link below:   English Link: [email or web address]  Spanish Link: [email or web address]  

## 2021-03-02 NOTE — Progress Notes (Addendum)
New OB Intake  I connected with  Christie Reyes on 03/02/21 at  2:00 PM EST by telephone Video Visit and verified that I am speaking with the correct person using two identifiers. Nurse is located at [facility] and pt is located at home.  I discussed the limitations, risks, security and privacy concerns of performing an evaluation and management service by telephone and the availability of in person appointments. I also discussed with the patient that there may be a patient responsible charge related to this service. The patient expressed understanding and agreed to proceed.  I explained I am completing New OB Intake today. We discussed her EDD of 10/04/21 that is based on [place] on 02/06/21. Pt is G1/P0. I reviewed her allergies, medications, Medical/Surgical/OB history, and appropriate screenings. I informed her of [facility] services. Based on history, this is a/an  pregnancy uncomplicated .   Patient Active Problem List   Diagnosis Date Noted   ASCUS with positive high risk HPV cervical 11/20/2019   Encounter for BCP (birth control pills) initial prescription 10/25/2019   Acute pyelonephritis 01/16/2019   Migraine 03/12/2012    Concerns addressed today  Delivery Plans:  Plans to deliver at [facility].   MyChart/Babyscripts MyChart access verified. I explained pt will have some visits in office and some virtually. Babyscripts instructions given and order placed. Patient verifies receipt of registration text/e-mail. Account successfully created and app downloaded.  Blood Pressure Cuff  Blood pressure cuff ordered for patient to pick-up from [employer]. Explained after first prenatal appt pt will check weekly and document in Babyscripts.  Weight scale: Patient does not have weight scale. Weight scale ordered   Anatomy US Explained first scheduled [place] will be around 19 weeks. Anatomy US scheduled for 19wks at [facility]. Pt notified to arrive at [facility].  Labs Discussed Avelina Laine genetic screening with  patient. Would like both Panorama and Horizon drawn at new OB visit. Routine prenatal labs needed.  Covid Vaccine Patient has covid vaccine.   Social Determinants of Health Food Insecurity: Patient expresses food insecurity. Food Market information given to patient; explained patient may visit at the end of first OB appointment. WIC Referral: Patient is interested in referral to [facility].  Transportation: Patient denies transportation needs. Childcare: Discussed no children allowed at ultrasound appointments. Offered childcare services; patient declines childcare services at this time.  First visit review I reviewed new OB appt with pt. I explained she will have a pelvic exam, ob bloodwork with genetic screening, and PAP smear. Explained pt will be seen by Donia Ast, NP at first visit; encounter routed to appropriate provider. Explained that patient will be seen by pregnancy navigator following visit with provider. [facility] information placed in AVS.   Harrel Lemon, RN 03/02/2021  2:14 PM       Patient was assessed and managed by nursing staff during this encounter. I have reviewed the chart and agree with the documentation and plan. I have also made any necessary editorial changes.  Marylen Ponto, NP 03/02/2021 3:39 PM

## 2021-03-05 ENCOUNTER — Other Ambulatory Visit: Payer: Self-pay

## 2021-03-05 ENCOUNTER — Inpatient Hospital Stay (HOSPITAL_COMMUNITY)
Admission: AD | Admit: 2021-03-05 | Discharge: 2021-03-06 | Disposition: A | Discharge status: Home / Self Care | Payer: Medicaid Other | Attending: Obstetrics & Gynecology | Admitting: Obstetrics & Gynecology

## 2021-03-05 DIAGNOSIS — R1030 Lower abdominal pain, unspecified: Secondary | ICD-10-CM | POA: Insufficient documentation

## 2021-03-05 DIAGNOSIS — R519 Headache, unspecified: Secondary | ICD-10-CM | POA: Insufficient documentation

## 2021-03-05 DIAGNOSIS — O26891 Other specified pregnancy related conditions, first trimester: Secondary | ICD-10-CM | POA: Insufficient documentation

## 2021-03-05 DIAGNOSIS — Z5329 Procedure and treatment not carried out because of patient's decision for other reasons: Secondary | ICD-10-CM | POA: Insufficient documentation

## 2021-03-05 DIAGNOSIS — Z3A09 9 weeks gestation of pregnancy: Secondary | ICD-10-CM | POA: Insufficient documentation

## 2021-03-05 LAB — URINALYSIS, ROUTINE W REFLEX MICROSCOPIC
Bilirubin Urine: NEGATIVE
Glucose, UA: NEGATIVE mg/dL
Hgb urine dipstick: NEGATIVE
Ketones, ur: 20 mg/dL — AB
Leukocytes,Ua: NEGATIVE
Nitrite: NEGATIVE
Protein, ur: NEGATIVE mg/dL
Specific Gravity, Urine: 1.027 (ref 1.005–1.030)
pH: 5 (ref 5.0–8.0)

## 2021-03-05 NOTE — MAU Note (Signed)
..  Christie Reyes is a 25 y.o. at [redacted]w[redacted]d here in MAU reporting: a headache for 1 week, has taken tylenol for the pain but has not had relief. Reports a pain that starts under her ribcage, goes down her pelvic area and shoots down her left leg. Reports constant lower abdominal pain that feels like pressure.  Denies vaginal bleeding.  Reports vomiting every day since she found out she was pregnant, twice today.  Pain score: h/a 10/10. Shooting pain 10/10. Lower abdominal pain 9/10 Vitals:   03/05/21 2310  BP: 119/80  Pulse: 84  Resp: 20  Temp: 98.3 F (36.8 C)  SpO2: 100%     Lab orders placed from triage: UA

## 2021-03-06 DIAGNOSIS — Z5329 Procedure and treatment not carried out because of patient's decision for other reasons: Secondary | ICD-10-CM | POA: Diagnosis not present

## 2021-03-06 DIAGNOSIS — O26891 Other specified pregnancy related conditions, first trimester: Secondary | ICD-10-CM | POA: Diagnosis present

## 2021-03-06 DIAGNOSIS — R519 Headache, unspecified: Secondary | ICD-10-CM | POA: Diagnosis not present

## 2021-03-06 DIAGNOSIS — R1030 Lower abdominal pain, unspecified: Secondary | ICD-10-CM | POA: Diagnosis not present

## 2021-03-06 DIAGNOSIS — Z3A09 9 weeks gestation of pregnancy: Secondary | ICD-10-CM | POA: Diagnosis not present

## 2021-03-06 LAB — WET PREP, GENITAL
Clue Cells Wet Prep HPF POC: NONE SEEN
Sperm: NONE SEEN
Trich, Wet Prep: NONE SEEN
WBC, Wet Prep HPF POC: <10 (ref <10)
Yeast Wet Prep HPF POC: NONE SEEN

## 2021-03-06 NOTE — MAU Note (Signed)
Asked registration about patient. Registration reported patient left, AMA form was not signed.

## 2021-03-08 ENCOUNTER — Encounter: Payer: Medicaid Other | Admitting: Licensed Clinical Social Worker

## 2021-03-08 LAB — GC/CHLAMYDIA PROBE AMP (~~LOC~~) NOT AT ARMC
Chlamydia: NEGATIVE
Comment: NEGATIVE
Comment: NORMAL
Neisseria Gonorrhea: NEGATIVE

## 2021-03-17 ENCOUNTER — Other Ambulatory Visit: Payer: Self-pay

## 2021-03-17 ENCOUNTER — Ambulatory Visit (INDEPENDENT_AMBULATORY_CARE_PROVIDER_SITE_OTHER): Payer: Medicaid Other

## 2021-03-17 ENCOUNTER — Ambulatory Visit (INDEPENDENT_AMBULATORY_CARE_PROVIDER_SITE_OTHER): Payer: Medicaid Other | Admitting: Women's Health

## 2021-03-17 ENCOUNTER — Other Ambulatory Visit (HOSPITAL_COMMUNITY)
Admission: RE | Admit: 2021-03-17 | Discharge: 2021-03-17 | Disposition: A | Discharge status: Home / Self Care | Payer: Medicaid Other | Source: Ambulatory Visit | Attending: Women's Health | Admitting: Women's Health

## 2021-03-17 VITALS — BP 128/81 | HR 88 | Wt 156.6 lb

## 2021-03-17 DIAGNOSIS — R87810 Cervical high risk human papillomavirus (HPV) DNA test positive: Secondary | ICD-10-CM | POA: Insufficient documentation

## 2021-03-17 DIAGNOSIS — Z3480 Encounter for supervision of other normal pregnancy, unspecified trimester: Secondary | ICD-10-CM | POA: Diagnosis present

## 2021-03-17 DIAGNOSIS — Z3A11 11 weeks gestation of pregnancy: Secondary | ICD-10-CM | POA: Diagnosis not present

## 2021-03-17 DIAGNOSIS — O368390 Maternal care for abnormalities of the fetal heart rate or rhythm, unspecified trimester, not applicable or unspecified: Secondary | ICD-10-CM

## 2021-03-17 DIAGNOSIS — F1290 Cannabis use, unspecified, uncomplicated: Secondary | ICD-10-CM

## 2021-03-17 DIAGNOSIS — S0092XS Blister (nonthermal) of unspecified part of head, sequela: Secondary | ICD-10-CM

## 2021-03-17 DIAGNOSIS — G43909 Migraine, unspecified, not intractable, without status migrainosus: Secondary | ICD-10-CM

## 2021-03-17 DIAGNOSIS — B029 Zoster without complications: Secondary | ICD-10-CM | POA: Insufficient documentation

## 2021-03-17 DIAGNOSIS — R87610 Atypical squamous cells of undetermined significance on cytologic smear of cervix (ASC-US): Secondary | ICD-10-CM

## 2021-03-17 HISTORY — DX: Zoster without complications: B02.9

## 2021-03-17 NOTE — Patient Instructions (Addendum)
Maternity Assessment Unit (MAU)  The Maternity Assessment Unit (MAU) is located at the [facility] at [facility]. The address is: [address], [place], [place], [place] [address]. Please see map below for additional directions.    The Maternity Assessment Unit is designed to help you during your pregnancy, and for up to 6 weeks after delivery, with any pregnancy- or postpartum-related emergencies, if you think you are in labor, or if your water has broken. For example, if you experience nausea and vomiting, vaginal bleeding, severe abdominal or pelvic pain, elevated blood pressure or other problems related to your pregnancy or postpartum time, please come to the Maternity Assessment Unit for assistance.      First Trimester of Pregnancy The first trimester of pregnancy starts on the first day of your last menstrual period until the end of week 12. This is months 1 through 3 of pregnancy. A week after a sperm fertilizes an egg, the egg will implant into the wall of the uterus and begin to develop into a baby. By the end of 12 weeks, all the baby's organs will be formed and the baby will be 2-3 inches in size. Body changes during your first trimester Your body goes through many changes during pregnancy. The changes vary and generally return to normal after your baby is born. Physical changes You may gain or lose weight. Your breasts may begin to grow larger and become tender. The tissue that surrounds your nipples (areola) may become darker. Dark spots or blotches (chloasma or mask of pregnancy) may develop on your face. You may have changes in your hair. These can include thickening or thinning of your hair or changes in texture. Health changes You may feel nauseous, and you may vomit. You may have heartburn. You may develop headaches. You may develop constipation. Your gums may bleed and may be sensitive to brushing and flossing. Other changes You may  tire easily. You may urinate more often. Your menstrual periods will stop. You may have a loss of appetite. You may develop cravings for certain kinds of food. You may have changes in your emotions from day to day. You may have more vivid and strange dreams. Follow these instructions at home: Medicines Follow your health care provider's instructions regarding medicine use. Specific medicines may be either safe or unsafe to take during pregnancy. Do not take any medicines unless told to by your health care provider. Take a prenatal vitamin that contains at least 600 micrograms (mcg) of folic acid. Eating and drinking Eat a healthy diet that includes fresh fruits and vegetables, whole grains, good sources of protein such as meat, eggs, or tofu, and low-fat dairy products. Avoid raw meat and unpasteurized juice, milk, and cheese. These carry germs that can harm you and your baby. If you feel nauseous or you vomit: Eat 4 or 5 small meals a day instead of 3 large meals. Try eating a few soda crackers. Drink liquids between meals instead of during meals. You may need to take these actions to prevent or treat constipation: Drink enough fluid to keep your urine pale yellow. Eat foods that are high in fiber, such as beans, whole grains, and fresh fruits and vegetables. Limit foods that are high in fat and processed sugars, such as fried or sweet foods. Activity Exercise only as directed by your health care provider. Most people can continue their usual exercise routine during pregnancy. Try to exercise for 30 minutes at least 5 days  a week. Stop exercising if you develop pain or cramping in the lower abdomen or lower back. Avoid exercising if it is very hot or humid or if you are at high altitude. Avoid heavy lifting. If you choose to, you may have sex unless your health care provider tells you not to. Relieving pain and discomfort Wear a good support bra to relieve breast tenderness. Rest with  your legs elevated if you have leg cramps or low back pain. If you develop bulging veins (varicose veins) in your legs: Wear support hose as told by your health care provider. Elevate your feet for 15 minutes, 3-4 times a day. Limit salt in your diet. Safety Wear your seat belt at all times when driving or riding in a car. Talk with your health care provider if someone is verbally or physically abusive to you. Talk with your health care provider if you are feeling sad or have thoughts of hurting yourself. Lifestyle Do not use hot tubs, steam rooms, or saunas. Do not douche. Do not use tampons or scented sanitary pads. Do not use herbal remedies, alcohol, illegal drugs, or medicines that are not approved by your health care provider. Chemicals in these products can harm your baby. Do not use any products that contain nicotine or tobacco, such as cigarettes, e-cigarettes, and chewing tobacco. If you need help quitting, ask your health care provider. Avoid cat litter boxes and soil used by cats. These carry germs that can cause birth defects in the baby and possibly loss of the unborn baby (fetus) by miscarriage or stillbirth. General instructions During routine prenatal visits in the first trimester, your health care provider will do a physical exam, perform necessary tests, and ask you how things are going. Keep all follow-up visits. This is important. Ask for help if you have counseling or nutritional needs during pregnancy. Your health care provider can offer advice or refer you to specialists for help with various needs. Schedule a dentist appointment. At home, brush your teeth with a soft toothbrush. Floss gently. Write down your questions. Take them to your prenatal visits. Where to find more information American Pregnancy Association: americanpregnancy.org [employer]st[employer]ians and Gynecologists: https://www.todd-brady.net/ Office on [employer] Health:  M[employer].nz Contact a health care provider if you have: Dizziness. A fever. Mild pelvic cramps, pelvic pressure, or nagging pain in the abdominal area. Nausea, vomiting, or diarrhea that lasts for 24 hours or longer. A bad-smelling vaginal discharge. Pain when you urinate. Known exposure to a contagious illness, such as chickenpox, measles, Zika virus, HIV, or hepatitis. Get help right away if you have: Spotting or bleeding from your vagina. Severe abdominal cramping or pain. Shortness of breath or chest pain. Any kind of trauma, such as from a fall or a car crash. New or increased pain, swelling, or redness in an arm or leg. Summary The first trimester of pregnancy starts on the first day of your last menstrual period until the end of week 12 (months 1 through 3). Eating 4 or 5 small meals a day rather than 3 large meals may help to relieve nausea and vomiting. Do not use any products that contain nicotine or tobacco, such as cigarettes, e-cigarettes, and chewing tobacco. If you need help quitting, ask your health care provider. Keep all follow-up visits. This is important. This information is not intended to replace advice given to you by your health care provider. Make sure you discuss any questions you have with your health care provider. Document Revised: 08/28/2019  Document Reviewed: 07/04/2019 Elsevier Patient Education  2022 Elsevier Inc.                         Safe Medications in Pregnancy    Acne: Benzoyl Peroxide Salicylic Acid  Backache/Headache: Tylenol: 2 regular strength every 4 hours OR              2 Extra strength every 6 hours  Colds/Coughs/Allergies: Benadryl (alcohol free) 25 mg every 6 hours as needed Breath right strips Claritin Cepacol throat lozenges Chloraseptic throat spray Cold-Eeze- up to three times per day Cough drops, alcohol free Flonase (by prescription only) Guaifenesin Mucinex Robitussin DM (plain only, alcohol  free) Saline nasal spray/drops Sudafed (pseudoephedrine) & Actifed ** use only after [redacted] weeks gestation and if you do not have high blood pressure Tylenol Vicks Vaporub Zinc lozenges Zyrtec   Constipation: Colace Ducolax suppositories Fleet enema Glycerin suppositories Metamucil Milk of magnesia Miralax Senokot Smooth move tea  Diarrhea: Kaopectate Imodium A-D  *NO pepto Bismol  Hemorrhoids: Anusol Anusol HC Preparation H Tucks  Indigestion: Tums Maalox Mylanta Zantac  Pepcid  Insomnia: Benadryl (alcohol free) 25mg  every 6 hours as needed Tylenol PM Unisom, no Gelcaps  Leg Cramps: Tums MagGel  Nausea/Vomiting:  Bonine Dramamine Emetrol Ginger extract Sea bands Meclizine  Nausea medication to take during pregnancy:  Unisom (doxylamine succinate 25 mg tablets) Take one tablet daily at bedtime. If symptoms are not adequately controlled, the dose can be increased to a maximum recommended dose of two tablets daily (1/2 tablet in the morning, 1/2 tablet mid-afternoon and one at bedtime). Vitamin B6 100mg  tablets. Take one tablet twice a day (up to 200 mg per day).  Skin Rashes: Aveeno products Benadryl cream or 25mg  every 6 hours as needed Calamine Lotion 1% cortisone cream  Yeast infection: Gyne-lotrimin 7 Monistat 7   **If taking multiple medications, please check labels to avoid duplicating the same active ingredients **take medication as directed on the label ** Do not exceed 4000 mg of tylenol in 24 hours **Do not take medications that contain aspirin or ibuprofen         For prevention of migraines in pregnancy: -Magnesium, 400mg  by mouth, once daily -Vitamin B2, 400mg  by mouth, once daily

## 2021-03-17 NOTE — Progress Notes (Signed)
Patient presents for Initial prenatal visit. Patient states that she has been getting blisters on her chin and states that she recently had blisters on the back her left thigh. States that these blisters come about every three months on thigh, but this is the first time it has shown up on her face. States that the blisters are painful. Patient has no other concerns today.

## 2021-03-17 NOTE — Progress Notes (Signed)
History:   Christie Reyes is a 25 y.o. G1P0 at 12w2dby early ultrasound being seen today for her first obstetrical visit.  Her obstetrical history is significant for  marijuana use, migraines . Patient does intend to breast feed. Pregnancy history fully reviewed.  Allergies: Cefdinir, eggs, sulfur, dustmites, cats, flu vaccine Current Medications: none PMH: migraines. No HTN, DM, asthma. PSH: tonisils/adenoids removed 2005 OB Hx: none Social Hx: pt does not smoke, drink. Patient smokes marijuana for headaches, which she reports works. Uses once per month, but has cut back since pregnancy.  Family Hx: dwarfism and cleft feet in children of grandmother's cousins Pt declines flu vaccine.  Patient reports headache. Pt has not yet taken anything.      HISTORY: OB History  Gravida Para Term Preterm AB Living  1 0 0 0 0 0  SAB IAB Ectopic Multiple Live Births  0 0 0 0 0    # Outcome Date GA Lbr Len/2nd Weight Sex Delivery Anes PTL Lv  1 Current             Last pap smear was done 10/2019 and was abnormal - ASCUS/+HPV  Past Medical History:  Diagnosis Date   Acute pyelonephritis 01/16/2019   Anxiety    Depression    Migraine    PCOS (polycystic ovarian syndrome)    Renal stones    Past Surgical History:  Procedure Laterality Date   adnoidectomy and tonsilectomy     TONSILLECTOMY AND ADENOIDECTOMY     Family History  Problem Relation Age of Onset   Diabetes Mother    Cancer Father    Diabetes Father    Diabetes Paternal Grandmother    Social History   Tobacco Use   Smoking status: Never   Smokeless tobacco: Never  Vaping Use   Vaping Use: Never used  Substance Use Topics   Alcohol use: No   Drug use: Not Currently    Types: Marijuana    Comment: Last smoked 02/04/2021   Allergies  Allergen Reactions   Cefdinir Anaphylaxis   Eggs Or Egg-Derived Products Anaphylaxis   Elemental Sulfur Shortness Of Breath and Swelling   Other Anaphylaxis, Cough and Rash     Dustmites, cats   Cat Hair Extract    Influenza Vac Typ  [Influenza Virus Vaccine]     Allergy to eggs   Sulfa Antibiotics    Sulfamethoxazole Swelling   Current Outpatient Medications on File Prior to Visit  Medication Sig Dispense Refill   Blood Pressure Monitoring (BLOOD PRESSURE KIT) DEVI 1 Device by Does not apply route once a week. 1 each 0   Misc. Devices (GOJJI WEIGHT SCALE) MISC 1 Device by Does not apply route once a week. 1 each 0   Prenatal Vit-Fe Fumarate-FA (PREPLUS) 27-1 MG TABS Take 1 tablet by mouth daily. 30 tablet 13   promethazine (PHENERGAN) 25 MG tablet Take 0.5-1 tablets (12.5-25 mg total) by mouth every 6 (six) hours as needed for nausea or vomiting. 30 tablet 1   EPINEPHrine (EPIPEN JR) 0.15 MG/0.3ML injection Inject into the muscle. (Patient not taking: Reported on 03/17/2021)     EPINEPHrine 0.3 mg/0.3 mL IJ SOAJ injection Inject 0.3 mg into the muscle as needed for anaphylaxis. (Patient not taking: Reported on 03/17/2021)     scopolamine (TRANSDERM-SCOP) 1 MG/3DAYS Place 1 patch (1.5 mg total) onto the skin every 3 (three) days. (Patient not taking: Reported on 03/02/2021) 10 patch 1   [DISCONTINUED] FLUoxetine (PROZAC) 20 MG  tablet After 2 weeks of taking 81m, if needed you can take this with 269mfor a total of 4062may 15 tablet 3   [DISCONTINUED] levonorgestrel-ethinyl estradiol (AVIANE) 0.1-20 MG-MCG tablet Take 1 tablet by mouth daily. 1 Package 11   No current facility-administered medications on file prior to visit.    Review of Systems Pertinent items noted in HPI and remainder of comprehensive ROS otherwise negative.  Physical Exam:   Vitals:   03/17/21 1337  BP: 128/81  Pulse: 88  Weight: 156 lb 9.6 oz (71 kg)    Bedside Ultrasound for FHR check: Viable intrauterine pregnancy with positive cardiac activity noted, fetal heart rate 150bpm, unable to hear FHT with Doppler Patient informed that the ultrasound is considered a limited obstetric  ultrasound and is not intended to be a complete ultrasound exam.  Patient also informed that the ultrasound is not being completed with the intent of assessing for fetal or placental anomalies or any pelvic abnormalities.  Explained that the purpose of todays ultrasound is to assess for fetal heart rate.  Patient acknowledges the purpose of the exam and the limitations of the study. General: well-developed, well-nourished female in no acute distress  Breasts:  normal appearance, no masses or tenderness bilaterally, exam done in the presence of a chaperone.   Skin: normal coloration and turgor, no rashes  Neurologic: oriented, normal, negative, normal mood  Extremities: normal strength, tone, and muscle mass, ROM of all joints is normal  HEENT PERRLA, extraocular movement intact and sclera clear, anicteric  Neck supple and no masses  Cardiovascular: regular rate and rhythm  Respiratory:  no respiratory distress, normal breath sounds  Abdomen: soft, non-tender; bowel sounds normal; no masses,  no organomegaly  Pelvic: normal external genitalia, no lesions, normal vaginal mucosa, normal vaginal discharge, normal cervix, pap smear done. Exam done in the presence of a chaperone.     Assessment:    Pregnancy: G1P0 Patient Active Problem List   Diagnosis Date Noted   Supervision of other normal pregnancy, antepartum 03/02/2021   ASCUS with positive high risk HPV cervical 11/20/2019   Acute pyelonephritis 01/16/2019   PCOS (polycystic ovarian syndrome) 05/24/2016   Irregular menses 01/06/2015   Migraine 03/12/2012   Multiple food allergies 12/17/2010     Plan:    1. Supervision of other normal pregnancy, antepartum - Genetic Screening - Culture, OB Urine - Obstetric Panel, Including HIV - Hepatitis C antibody - Cytology - PAP( [place]) - US [place] OB DETAIL +14 WK; Future  2. ASCUS with positive high risk HPV cervical - Cytology - PAP( [place])  3. Migraine without status  migrainosus, not intractable, unspecified migraine type - AMB referral to headache clinic - Comp Met (CMET) - Protein / creatinine ratio, urine  4. Blister head, sequela - Herpes simplex virus culture  5. Herpes zoster without complication  Initial labs drawn. Continue prenatal vitamins. Problem list reviewed and updated. Genetic Screening discussed, NIPS: ordered. Ultrasound discussed; fetal anatomic survey: ordered. Anticipatory guidance about prenatal visits given including labs, ultrasounds, and testing. Discussed usage of Babyscripts and virtual visits as additional source of managing and completing prenatal visits in midst of coronavirus and pandemic.   Encouraged to complete MyChart Registration for her ability to review results, send requests, and have questions addressed.  The [facility] with multiple MDs and Advanced Practice Providers was explained to patient; also emphasized that residents, students are part of our team. Routine obstetric  precautions reviewed. Encouraged to seek out care at office or emergency room [facility]U preferred) for urgent and/or emergent concerns. Return in about 4 weeks (around 04/14/2021) for in-person LOB/APP OK.    Clarisa Fling, NP  2:31 PM 03/17/2021

## 2021-03-18 LAB — OBSTETRIC PANEL, INCLUDING HIV
Antibody Screen: NEGATIVE
Basophils Absolute: 0.1 10*3/uL (ref 0.0–0.2)
Basos: 1 %
EOS (ABSOLUTE): 0.1 10*3/uL (ref 0.0–0.4)
Eos: 1 %
HIV Screen 4th Generation wRfx: NONREACTIVE
Hematocrit: 39.8 % (ref 34.0–46.6)
Hemoglobin: 13.5 g/dL (ref 11.1–15.9)
Hepatitis B Surface Ag: NEGATIVE
Immature Grans (Abs): 0 10*3/uL (ref 0.0–0.1)
Immature Granulocytes: 0 %
Lymphocytes Absolute: 2.1 10*3/uL (ref 0.7–3.1)
Lymphs: 20 %
MCH: 30.9 pg (ref 26.6–33.0)
MCHC: 33.9 g/dL (ref 31.5–35.7)
MCV: 91 fL (ref 79–97)
Monocytes Absolute: 0.6 10*3/uL (ref 0.1–0.9)
Monocytes: 6 %
Neutrophils Absolute: 7.5 10*3/uL — ABNORMAL HIGH (ref 1.4–7.0)
Neutrophils: 72 %
Platelets: 354 10*3/uL (ref 150–450)
RBC: 4.37 x10E6/uL (ref 3.77–5.28)
RDW: 12 % (ref 11.7–15.4)
RPR Ser Ql: NONREACTIVE
Rh Factor: POSITIVE
Rubella Antibodies, IGG: 1.31 index (ref >0.99)
WBC: 10.3 10*3/uL (ref 3.4–10.8)

## 2021-03-18 LAB — COMPREHENSIVE METABOLIC PANEL
ALT: 9 IU/L (ref 0–32)
AST: 14 IU/L (ref 0–40)
Albumin/Globulin Ratio: 2 (ref 1.2–2.2)
Albumin: 4.6 g/dL (ref 3.9–5.0)
Alkaline Phosphatase: 69 IU/L (ref 44–121)
BUN/Creatinine Ratio: 11 (ref 9–23)
BUN: 7 mg/dL (ref 6–20)
Bilirubin Total: 0.2 mg/dL (ref 0.0–1.2)
CO2: 21 mmol/L (ref 20–29)
Calcium: 9.6 mg/dL (ref 8.7–10.2)
Chloride: 101 mmol/L (ref 96–106)
Creatinine, Ser: 0.63 mg/dL (ref 0.57–1.00)
Globulin, Total: 2.3 g/dL (ref 1.5–4.5)
Glucose: 77 mg/dL (ref 70–99)
Potassium: 4.7 mmol/L (ref 3.5–5.2)
Sodium: 137 mmol/L (ref 134–144)
Total Protein: 6.9 g/dL (ref 6.0–8.5)
eGFR: 129 mL/min/{1.73_m2} (ref >59)

## 2021-03-18 LAB — HEPATITIS C ANTIBODY: Hep C Virus Ab: <0.1 s/co ratio (ref 0.0–0.9)

## 2021-03-18 LAB — PROTEIN / CREATININE RATIO, URINE
Creatinine, Urine: 98.6 mg/dL
Protein, Ur: 9.8 mg/dL
Protein/Creat Ratio: 99 mg/g creat (ref 0–200)

## 2021-03-19 LAB — CULTURE, OB URINE

## 2021-03-19 LAB — URINE CULTURE, OB REFLEX

## 2021-03-20 LAB — HERPES SIMPLEX VIRUS CULTURE

## 2021-03-22 ENCOUNTER — Encounter: Payer: Medicaid Other | Admitting: Licensed Clinical Social Worker

## 2021-03-23 LAB — HEPATITIS C ANTIBODY
HCV Ab: NEGATIVE
HCV Ab: NEGATIVE

## 2021-03-23 LAB — CYTOLOGY - PAP
Comment: NEGATIVE
Diagnosis: UNDETERMINED — AB
High risk HPV: NEGATIVE

## 2021-03-26 LAB — ANAEROBIC AND AEROBIC CULTURE

## 2021-03-30 ENCOUNTER — Encounter: Payer: Self-pay | Admitting: Women's Health

## 2021-03-31 ENCOUNTER — Other Ambulatory Visit: Payer: Self-pay | Admitting: Nurse Practitioner

## 2021-03-31 DIAGNOSIS — B029 Zoster without complications: Secondary | ICD-10-CM

## 2021-03-31 DIAGNOSIS — L309 Dermatitis, unspecified: Secondary | ICD-10-CM

## 2021-03-31 MED ORDER — ACYCLOVIR 5 % EX OINT
1.0000 | TOPICAL_OINTMENT | CUTANEOUS | 0 refills | Status: AC
Start: 2021-03-31 — End: 2021-04-05

## 2021-03-31 NOTE — Progress Notes (Unsigned)
Christie Reyes 25 y.o.  [redacted]w[redacted]d  Having difficulty with area on thigh - outbreak noted in 11-2019 as herpes zoster.  Area was healing but now is worsening.  Consult with Dr. Para March - will use topical Zovirax for now and will refer to Dermatology for further evaluation and treatment.  Having difficulty with rash on chin.  Was improving but now new spots arising.  Consult with Dr. Para March and reviewed culture results.  First choice would be to prescribe Bactrim but patient has allergy to sulfa.  Based on culture report - would next try Keflex but has to Cefdinir of anaphylaxis so also rules out potential meds normally prescribed such as Augmentin, and Dicloxacillin.  Consulted with Dr. Para March.  Will try topical Neosporin due to allergy status.  Nolene Bernheim, RN, MSN, NP-BC Nurse Practitioner, [facility] for [employer], [facility] 03/31/2021 1:43 PM

## 2021-04-04 NOTE — L&D Delivery Note (Signed)
Patient complete and pushing. SVD of viable female infant over intact perineum. Nuchal cord none. Infant delivered to mom's abdomen. Delayed cord clamping x 1 minute. Cord clamped x 2, cut. Spontaneous cry heard. Weight and Apgars pending. Cord blood obtained. Placenta delivered spontaneously and intact. LUS cleared of clot Vagina inspected.1st degree lacerations noted. Repaired with Vicryl Rapide EBL: 75cc Anesthesia: epidural

## 2021-04-06 DIAGNOSIS — S0092XS Blister (nonthermal) of unspecified part of head, sequela: Secondary | ICD-10-CM | POA: Insufficient documentation

## 2021-04-06 DIAGNOSIS — F1290 Cannabis use, unspecified, uncomplicated: Secondary | ICD-10-CM | POA: Insufficient documentation

## 2021-04-08 ENCOUNTER — Telehealth: Payer: Self-pay | Admitting: Radiology

## 2021-04-08 NOTE — Telephone Encounter (Signed)
Left message for patient to call [facility] for scheduling of New Headache appointment.

## 2021-04-15 ENCOUNTER — Other Ambulatory Visit: Payer: Self-pay

## 2021-04-15 ENCOUNTER — Ambulatory Visit (INDEPENDENT_AMBULATORY_CARE_PROVIDER_SITE_OTHER): Payer: Medicaid Other

## 2021-04-15 VITALS — BP 120/69 | HR 95 | Wt 161.0 lb

## 2021-04-15 DIAGNOSIS — Z3480 Encounter for supervision of other normal pregnancy, unspecified trimester: Secondary | ICD-10-CM

## 2021-04-15 DIAGNOSIS — Z3A15 15 weeks gestation of pregnancy: Secondary | ICD-10-CM

## 2021-04-15 NOTE — Progress Notes (Signed)
° °  PRENATAL VISIT NOTE  Subjective:  Christie Reyes is a 26 y.o. G1P0 at [redacted]w[redacted]d being seen today for ongoing prenatal care.  She is currently monitored for the following issues for this low-risk pregnancy and has Migraine; ASCUS with positive high risk HPV cervical; Multiple food allergies; PCOS (polycystic ovarian syndrome); Supervision of other normal pregnancy, antepartum; Herpes zoster without complication; Marijuana use; and Blister head, sequela on their problem list.  Patient reports no complaints.  Contractions: Not present. Vag. Bleeding: None.   . Denies leaking of fluid.   The following portions of the patient's history were reviewed and updated as appropriate: allergies, current medications, past family history, past medical history, past social history, past surgical history and problem list.   Objective:   Vitals:   04/15/21 1013  BP: 120/69  Pulse: 95  Weight: 161 lb (73 kg)    Fetal Status: Fetal Heart Rate (bpm): 155         General:  Alert, oriented and cooperative. Patient is in no acute distress.  Skin: Skin is warm and dry. No rash noted.   Cardiovascular: Normal heart rate noted  Respiratory: Normal respiratory effort, no problems with respiration noted  Abdomen: Soft, gravid, appropriate for gestational age.  Pain/Pressure: Absent     Pelvic: Cervical exam deferred        Extremities: Normal range of motion.     Mental Status: Normal mood and affect. Normal behavior. Normal judgment and thought content.   Assessment and Plan:  Pregnancy: G1P0 at [redacted]w[redacted]d 1. Supervision of other normal pregnancy, antepartum - Routine OB. Doing well. No concerns - Anticipatory guidance for upcoming appointments provided  - AFP, Serum, Open Spina Bifida  2. [redacted] weeks gestation of pregnancy    Preterm labor symptoms and general obstetric precautions including but not limited to vaginal bleeding, contractions, leaking of fluid and fetal movement were reviewed in detail with the  patient. Please refer to After Visit Summary for other counseling recommendations.   Return in about 4 weeks (around 05/13/2021).  Future Appointments  Date Time Provider Department Center  05/10/2021  1:45 PM [facility] US[facility]  05/14/2021 10:35 AM Hermina Staggers, MD [facility] None  06/18/2021 10:15 AM Bruna Potter Edwena Blow, PA-C [facility]    Brand Males, CNM 04/15/21 10:52 AM

## 2021-04-21 ENCOUNTER — Telehealth: Payer: Self-pay | Admitting: Lactation Services

## 2021-04-21 DIAGNOSIS — R772 Abnormality of alphafetoprotein: Secondary | ICD-10-CM

## 2021-04-21 DIAGNOSIS — Z3480 Encounter for supervision of other normal pregnancy, unspecified trimester: Secondary | ICD-10-CM

## 2021-04-21 LAB — AFP, SERUM, OPEN SPINA BIFIDA
AFP MoM: 3.1
AFP Value: 96 ng/mL
Gest. Age on Collection Date: 15.4 weeks
Maternal Age At EDD: 23.5 yr
OSBR Risk 1 IN: 100
Test Results:: POSITIVE — AB
Weight: 161 [lb_av]

## 2021-04-21 NOTE — Telephone Encounter (Signed)
Called MFM to schedule Genetic Counseling for + AFP. Scheduled for 1/25 to check in at 8:45 am for Genetic Counseling. Patient has [place] scheduled for 05/10/2021. Per [place] order need to be changed to [facility] Detailed.   Called patient to discuss results and recommendations. She was informed that her + AFP and Genetic Counseling, as well as [place] appointments.   Patient is very concerned, reviewed that the results could possibly be a false positive and that further [place] and possibly further testing may be offered by MFM or Genetic Counseling. Patient voiced understanding.

## 2021-04-28 ENCOUNTER — Ambulatory Visit: Payer: Self-pay | Admitting: Genetics

## 2021-04-28 ENCOUNTER — Other Ambulatory Visit: Payer: Self-pay

## 2021-04-28 ENCOUNTER — Ambulatory Visit: Payer: Medicaid Other

## 2021-04-28 DIAGNOSIS — O288 Other abnormal findings on antenatal screening of mother: Secondary | ICD-10-CM | POA: Diagnosis not present

## 2021-04-28 NOTE — Progress Notes (Signed)
Name: Christie Reyes Indication: MSAFP result high risk for ONTDs (risk is 1 in 100 after the screen)  DOB: Sep 27, 1995 Age: 26 y.o.   EDC: 10/04/2021 LMP: Not known Referring Provider:  Brand Males, CNM  EGA: [redacted]w[redacted]d Genetic Counselor: Teena Dunk, MS, CGC  OB Hx: G1P0 Date of Appointment: 04/28/2021  Accompanied by: Christie Reyes Face to Face Time: 30 Minutes   Previous Testing Completed: Christie Reyes previously completed Non-Invasive Prenatal Screening (NIPS) in this pregnancy (scanned into Epic under the Media tab). The result is low risk, consistent with a female fetus. This screening significantly reduces the risk that the current pregnancy has Down syndrome, Trisomy 29, Trisomy 13, Monosomy X, and Triploidy, however, the risk is not zero given the limitations of NIPS. Additionally, there are many genetic conditions that cannot be detected by NIPS.  Christie Reyes previously completed carrier screening (scanned into Epic under the Media tab). She screened to not be a carrier for Cystic Fibrosis (CF), Spinal Muscular Atrophy (SMA), alpha thalassemia, and beta hemoglobinopathies. A negative result on carrier screening reduces the likelihood of being a carrier, however, does not entirely rule out the possibility.   Medical History:  Reports she takes prenatal vitamins daily. Reports she started taking folic acid after she learned of her MSAFP result. Reports bleeding in the first trimester of pregnancy (in the beginning and middle of October). This bleeding has resolved.  Reports use of cannabis in pregnancy.  Denies personal history of diabetes, high blood pressure, thyroid conditions, and seizures. Denies infections and fevers in this pregnancy. Denies using tobacco or alcohol in this pregnancy.   Family History: A pedigree was created and scanned into Epic under the Media tab. Christie Reyes reports that her distant maternal relatives (possibly her great grandparents) may have had club foot and cleft  lip. However, Christie Reyes is not sure about this. Maternal ethnicity reported as White/Caucasian and [place] and paternal ethnicity reported as Leisure centre manager. Denies Ashkenazi [place] ancestry. Family history not remarkable for consanguinity, intellectual disability, autism spectrum disorder, multiple spontaneous abortions, still births, or unexplained neonatal death.     Genetic Counseling:   MSAFP high risk for Open Neural Tube Defect (ONTD) risk is 1 in 100 after the screen. We discussed that the maternal serum AFP was elevated. This could indicate that the pregnancy has an ONTD which is a birth defect involving the incomplete development of the brain, spinal cord, or their protective coverings. Approximately 2-5% of ONTDs will be syndromic due to conditions such as chromosome abnormalities. When not associated with a particular condition, ONTDs are thought to have a multifactorial etiology. Nongenetic risk factors include reduced folate intake, maternal anticonvulsant medication, maternal diabetes mellitus, obesity, and hyperthermia. Prenatal detection of ONTDs is largely influenced by the gestational age and type of neural tube defect. A second trimester ultrasound identifies virtually 100% of anencephaly cases. A second trimester ultrasound also routinely evaluates for spina bifida, and examination of the entire length of the spine in the sagittal, axial, and coronal planes in combination with a cranial evaluation identifies many cases: the detection rate is approximately 90-98%. Christie Reyes is scheduled for an anatomy ultrasound on 05/10/2021. If Christie Reyes desires, she can pursue an amniocentesis to evaluate the amniotic fluid AFP with reflex to acetylcholinesterase (ACHE) studies.  Birth Defects. All babies have approximately a 3-5% risk for a birth defect and a majority of these defects cannot be detected through the screening or diagnostic testing listed below. Ultrasound may detect  some birth defects, but  it may not detect all birth defects. About half of pregnancies with Down syndrome do not show any soft markers on ultrasound. A normal ultrasound does not guarantee a healthy pregnancy.   Testing/Screening Options:   Amniocentesis. This procedure is available for prenatal diagnosis. Possible procedural difficulties and complications that can arise include maternal infection, cramping, bleeding, fluid leakage, and/or pregnancy loss. The risk for pregnancy loss with an amniocentesis is 1/500. Per the [employer]necologists (ACOG) Practice Bulletin 162, all pregnant women should be offered prenatal assessment for aneuploidy by diagnostic testing regardless of maternal age or other risk factors. If indicated, testing that could be ordered on an amniocentesis sample includes AF-AFP with reflex to ACHE, a fetal karyotype, fetal microarray, and testing for specific syndromes.    Patient Plan:  Proceed with: Routine prenatal care Informed consent was obtained. All questions were answered.  Declined: Amniocentesis   Thank you for sharing in the care of Christie Reyes with [place].  Please do not hesitate to contact us if you have any questions.  Teena Dunk, MS, [facility]

## 2021-05-07 ENCOUNTER — Encounter (HOSPITAL_COMMUNITY): Payer: Self-pay | Admitting: Obstetrics & Gynecology

## 2021-05-07 ENCOUNTER — Inpatient Hospital Stay (HOSPITAL_COMMUNITY)
Admission: AD | Admit: 2021-05-07 | Discharge: 2021-05-08 | Disposition: A | Discharge status: Home / Self Care | Payer: Medicaid Other | Attending: Obstetrics & Gynecology | Admitting: Obstetrics & Gynecology

## 2021-05-07 ENCOUNTER — Other Ambulatory Visit: Payer: Self-pay

## 2021-05-07 DIAGNOSIS — Z3A18 18 weeks gestation of pregnancy: Secondary | ICD-10-CM | POA: Insufficient documentation

## 2021-05-07 DIAGNOSIS — O23592 Infection of other part of genital tract in pregnancy, second trimester: Secondary | ICD-10-CM | POA: Insufficient documentation

## 2021-05-07 DIAGNOSIS — N760 Acute vaginitis: Secondary | ICD-10-CM | POA: Diagnosis not present

## 2021-05-07 DIAGNOSIS — O26892 Other specified pregnancy related conditions, second trimester: Secondary | ICD-10-CM | POA: Diagnosis present

## 2021-05-07 DIAGNOSIS — B9689 Other specified bacterial agents as the cause of diseases classified elsewhere: Secondary | ICD-10-CM | POA: Insufficient documentation

## 2021-05-07 DIAGNOSIS — O99891 Other specified diseases and conditions complicating pregnancy: Secondary | ICD-10-CM

## 2021-05-07 DIAGNOSIS — R1030 Lower abdominal pain, unspecified: Secondary | ICD-10-CM | POA: Diagnosis not present

## 2021-05-07 DIAGNOSIS — Z3480 Encounter for supervision of other normal pregnancy, unspecified trimester: Secondary | ICD-10-CM

## 2021-05-07 LAB — URINALYSIS, ROUTINE W REFLEX MICROSCOPIC
Bilirubin Urine: NEGATIVE
Glucose, UA: NEGATIVE mg/dL
Hgb urine dipstick: NEGATIVE
Ketones, ur: NEGATIVE mg/dL
Leukocytes,Ua: NEGATIVE
Nitrite: NEGATIVE
Protein, ur: NEGATIVE mg/dL
Specific Gravity, Urine: >1.03 — ABNORMAL HIGH (ref 1.005–1.030)
pH: 6 (ref 5.0–8.0)

## 2021-05-07 NOTE — MAU Note (Signed)
Having sharp abd pains since 1200. About 1500 noticed my stomach was tightening intermittently about every 7-80mins. Pain has gotten worse. Having pressure in lower abd and pelvis. No VB

## 2021-05-07 NOTE — MAU Provider Note (Signed)
History     CSN: [id number]  Arrival date and time: 05/07/21 2237   Event Date/Time   First Provider Initiated Contact with Patient 05/07/21 2350      Chief Complaint  Patient presents with   Abdominal Pain   Christie Reyes is a 26 y.o. G1P0 at 39w4dwho receives care at [facility].  She presents  today for Abdominal Pain.  She states she initially contributed her pain to eating greasy food when it started around noon today.  She reports the pain has gotten progressively worse throughout the day.  She states now the pain was every 7-10 minutes, but is now every 3 minutes ... "it feels like I am doing an ab workout."  She describes the pain as a "tightening and then releasing."  She rates her pain a 10/10. She denies sexual activity in the past 3 days.    OB History     Gravida  1   Para      Term      Preterm      AB      Living         SAB      IAB      Ectopic      Multiple      Live Births              Past Medical History:  Diagnosis Date   Acute pyelonephritis 01/16/2019   Anxiety    Depression    Migraine    PCOS (polycystic ovarian syndrome)    Renal stones     Past Surgical History:  Procedure Laterality Date   adnoidectomy and tonsilectomy     TONSILLECTOMY AND ADENOIDECTOMY      Family History  Problem Relation Age of Onset   Diabetes Mother    Cancer Father    Diabetes Father    Diabetes Paternal Grandmother     Social History   Tobacco Use   Smoking status: Never   Smokeless tobacco: Never  Vaping Use   Vaping Use: Never used  Substance Use Topics   Alcohol use: No   Drug use: Not Currently    Types: Marijuana    Comment: Last smoked 02/04/2021    Allergies:  Allergies  Allergen Reactions   Cefdinir Anaphylaxis   Eggs Or Egg-Derived Products Anaphylaxis   Elemental Sulfur Shortness Of Breath and Swelling   Other Anaphylaxis, Cough and Rash    Dustmites, cats   Cat Hair Extract    Influenza Vac Typ  [Influenza  Virus Vaccine]     Allergy to eggs   Sulfa Antibiotics    Sulfamethoxazole Swelling    Medications Prior to Admission  Medication Sig Dispense Refill Last Dose   Prenatal Vit-Fe Fumarate-FA (PREPLUS) 27-1 MG TABS Take 1 tablet by mouth daily. (Patient taking differently: Take 1 tablet by mouth 2 times daily at 12 noon and 4 pm.) 30 tablet 13 05/07/2021 at 0900   Blood Pressure Monitoring (BLOOD PRESSURE KIT) DEVI 1 Device by Does not apply route once a week. 1 each 0    EPINEPHrine (EPIPEN JR) 0.15 MG/0.3ML injection Inject into the muscle. (Patient not taking: Reported on 03/17/2021)      EPINEPHrine 0.3 mg/0.3 mL IJ SOAJ injection Inject 0.3 mg into the muscle as needed for anaphylaxis. (Patient not taking: Reported on 03/17/2021)      Misc. Devices (GOJJI WEIGHT SCALE) MISC 1 Device by Does not apply route once a week. 1 each  0    promethazine (PHENERGAN) 25 MG tablet Take 0.5-1 tablets (12.5-25 mg total) by mouth every 6 (six) hours as needed for nausea or vomiting. 30 tablet 1    scopolamine (TRANSDERM-SCOP) 1 MG/3DAYS Place 1 patch (1.5 mg total) onto the skin every 3 (three) days. (Patient not taking: Reported on 03/02/2021) 10 patch 1     Review of Systems  Constitutional:  Negative for chills and fever.  Respiratory:  Negative for cough and shortness of breath.   Gastrointestinal:  Positive for abdominal pain and nausea. Negative for vomiting.  Genitourinary:  Positive for pelvic pain (Pressure). Negative for difficulty urinating, dysuria, vaginal bleeding and vaginal discharge.  Musculoskeletal:  Positive for back pain (Mild RL).  Neurological:  Positive for headaches ("Constant" reports h/o migraines). Negative for dizziness and light-headedness.  Physical Exam   Blood pressure 117/66, pulse 80, temperature 98.1 F (36.7 C), resp. rate 17, height 5' 3"  (1.6 m), weight 76.6 kg, SpO2 100 %.  Physical Exam Vitals reviewed. Exam conducted with a chaperone present.  Constitutional:       Appearance: She is well-developed.  HENT:     Head: Normocephalic and atraumatic.  Eyes:     Conjunctiva/sclera: Conjunctivae normal.  Cardiovascular:     Rate and Rhythm: Normal rate.  Pulmonary:     Effort: Pulmonary effort is normal. No respiratory distress.     Breath sounds: Normal breath sounds.  Abdominal:     General: Abdomen is flat. Bowel sounds are normal.     Palpations: Abdomen is soft.     Tenderness: There is abdominal tenderness in the right lower quadrant, suprapubic area and left lower quadrant.  Genitourinary:    Vagina: Vaginal discharge (Small amt thick white discharge.) present.     Cervix: No cervical motion tenderness, discharge or friability.     Uterus: Enlarged (C/w dates) and tender.      Comments: Speculum Exam: -Normal External Genitalia: Non tender, no apparent discharge at introitus.  -Vaginal Vault: Pink mucosa with good rugae. Small amt thick white discharge -wet prep collected -Cervix:Pink, no lesions, cysts, or polyps.  Appears closed. No active bleeding from os-GC/CT collected -Bimanual Exam: Dilation: Closed Exam by:: Gavin Pound CNM   Skin:    General: Skin is warm and dry.  Neurological:     Mental Status: She is alert.   FHR 148 by doppler MAU Course  Procedures Results for orders placed or performed during the hospital encounter of 05/07/21 (from the past 24 hour(s))  Urinalysis, Routine w reflex microscopic Urine, Clean Catch     Status: Abnormal   Collection Time: 05/07/21 10:51 PM  Result Value Ref Range   Color, Urine YELLOW YELLOW   APPearance CLEAR CLEAR   Specific Gravity, Urine >1.030 (H) 1.005 - 1.030   pH 6.0 5.0 - 8.0   Glucose, UA NEGATIVE NEGATIVE mg/dL   Hgb urine dipstick NEGATIVE NEGATIVE   Bilirubin Urine NEGATIVE NEGATIVE   Ketones, ur NEGATIVE NEGATIVE mg/dL   Protein, ur NEGATIVE NEGATIVE mg/dL   Nitrite NEGATIVE NEGATIVE   Leukocytes,Ua NEGATIVE NEGATIVE  Wet prep, genital     Status: Abnormal    Collection Time: 05/08/21 12:01 AM   Specimen: Cervix  Result Value Ref Range   Yeast Wet Prep HPF POC NONE SEEN NONE SEEN   Trich, Wet Prep NONE SEEN NONE SEEN   Clue Cells Wet Prep HPF POC PRESENT (A) NONE SEEN   WBC, Wet Prep HPF POC >=10 (A) <10  Sperm NONE SEEN     MDM Pelvic Exam Cultures: Wet Prep and GC/CT Labs: UA Pain Medication Assessment and Plan  26 year old, G1P0  SIUP at 18.4 weeks Abdominal Pain  -Reviewed POC with patient. -Exam performed and findings discussed.  -Reassured that cervix closed. -Cultures collected and pending.  -Patient offered and accepts pain medication. -Will give tylenol and flexeril. -Will await results.    Maryann Conners 05/07/2021, 11:50 PM   Reassessment (12:56 AM)  Patient reports improvement in pain with medication. Results reviewed and recommendation for treatment made. Patient agreeable and reports familiarity with BV. Will send in script for Metrogel. Informed okay to take tylenol as needed for cramping. Keep next appt as scheduled. -Encouraged to call primary office or return to MAU if symptoms worsen or with the onset of new symptoms. -Discharged to home in improved condition.  Maryann Conners MSN, CNM Advanced Practice Provider, [facility]

## 2021-05-08 DIAGNOSIS — O99891 Other specified diseases and conditions complicating pregnancy: Secondary | ICD-10-CM

## 2021-05-08 DIAGNOSIS — Z3A18 18 weeks gestation of pregnancy: Secondary | ICD-10-CM

## 2021-05-08 DIAGNOSIS — N760 Acute vaginitis: Secondary | ICD-10-CM

## 2021-05-08 DIAGNOSIS — R1030 Lower abdominal pain, unspecified: Secondary | ICD-10-CM

## 2021-05-08 LAB — WET PREP, GENITAL
Sperm: NONE SEEN
Trich, Wet Prep: NONE SEEN
WBC, Wet Prep HPF POC: >=10 — AB (ref <10)
Yeast Wet Prep HPF POC: NONE SEEN

## 2021-05-08 MED ORDER — CYCLOBENZAPRINE HCL 5 MG PO TABS
10.0000 mg | ORAL_TABLET | Freq: Once | ORAL | Status: AC
  Administered 2021-05-08: 10 mg via ORAL
  Filled 2021-05-08: qty 2

## 2021-05-08 MED ORDER — METRONIDAZOLE 0.75 % VA GEL: 1.0000 | Freq: Every day | VAGINAL | 0 refills | Status: DC

## 2021-05-08 MED ORDER — ACETAMINOPHEN 500 MG PO TABS
1000.0000 mg | ORAL_TABLET | Freq: Once | ORAL | Status: AC
  Administered 2021-05-08: 1000 mg via ORAL
  Filled 2021-05-08: qty 2

## 2021-05-10 ENCOUNTER — Encounter: Payer: Self-pay | Admitting: *Deleted

## 2021-05-10 ENCOUNTER — Other Ambulatory Visit: Payer: Self-pay | Admitting: *Deleted

## 2021-05-10 ENCOUNTER — Other Ambulatory Visit: Payer: Self-pay

## 2021-05-10 ENCOUNTER — Ambulatory Visit: Payer: Medicaid Other | Attending: Women's Health

## 2021-05-10 ENCOUNTER — Ambulatory Visit: Payer: Medicaid Other | Admitting: *Deleted

## 2021-05-10 VITALS — BP 120/66 | HR 96

## 2021-05-10 DIAGNOSIS — O99322 Drug use complicating pregnancy, second trimester: Secondary | ICD-10-CM | POA: Insufficient documentation

## 2021-05-10 DIAGNOSIS — Z3689 Encounter for other specified antenatal screening: Secondary | ICD-10-CM

## 2021-05-10 DIAGNOSIS — Z3A19 19 weeks gestation of pregnancy: Secondary | ICD-10-CM | POA: Insufficient documentation

## 2021-05-10 DIAGNOSIS — R772 Abnormality of alphafetoprotein: Secondary | ICD-10-CM

## 2021-05-10 DIAGNOSIS — Z363 Encounter for antenatal screening for malformations: Secondary | ICD-10-CM | POA: Insufficient documentation

## 2021-05-10 DIAGNOSIS — O289 Unspecified abnormal findings on antenatal screening of mother: Secondary | ICD-10-CM | POA: Diagnosis not present

## 2021-05-10 DIAGNOSIS — Z3480 Encounter for supervision of other normal pregnancy, unspecified trimester: Secondary | ICD-10-CM

## 2021-05-14 ENCOUNTER — Other Ambulatory Visit: Payer: Self-pay

## 2021-05-14 ENCOUNTER — Ambulatory Visit (INDEPENDENT_AMBULATORY_CARE_PROVIDER_SITE_OTHER): Payer: Medicaid Other | Admitting: Obstetrics

## 2021-05-14 VITALS — BP 113/66 | HR 93 | Wt 168.0 lb

## 2021-05-14 DIAGNOSIS — Z3480 Encounter for supervision of other normal pregnancy, unspecified trimester: Secondary | ICD-10-CM

## 2021-05-14 NOTE — Progress Notes (Signed)
Subjective:  Christie Reyes is a 26 y.o. G1P0 at [redacted]w[redacted]d being seen today for ongoing prenatal care.  She is currently monitored for the following issues for this low-risk pregnancy and has Migraine; ASCUS with positive high risk HPV cervical; Multiple food allergies; PCOS (polycystic ovarian syndrome); Supervision of other normal pregnancy, antepartum; Herpes zoster without complication; Marijuana use; and Blister head, sequela on their problem list.  Patient reports backache and heartburn.  Contractions: Irritability. Vag. Bleeding: None.  Movement: Present. Denies leaking of fluid.   The following portions of the patient's history were reviewed and updated as appropriate: allergies, current medications, past family history, past medical history, past social history, past surgical history and problem list. Problem list updated.  Objective:   Vitals:   05/14/21 1042  BP: 113/66  Pulse: 93  Weight: 168 lb (76.2 kg)    Fetal Status: Fetal Heart Rate (bpm): 148   Movement: Present     General:  Alert, oriented and cooperative. Patient is in no acute distress.  Skin: Skin is warm and dry. No rash noted.   Cardiovascular: Normal heart rate noted  Respiratory: Normal respiratory effort, no problems with respiration noted  Abdomen: Soft, gravid, appropriate for gestational age. Pain/Pressure: Absent     Pelvic:  Cervical exam deferred        Extremities: Normal range of motion.  Edema: None  Mental Status: Normal mood and affect. Normal behavior. Normal judgment and thought content.   Urinalysis:      Assessment and Plan:  Pregnancy: G1P0 at [redacted]w[redacted]d  There are no diagnoses linked to this encounter. Preterm labor symptoms and general obstetric precautions including but not limited to vaginal bleeding, contractions, leaking of fluid and fetal movement were reviewed in detail with the patient. Please refer to After Visit Summary for other counseling recommendations.   Return in about 4 weeks  (around 06/11/2021) for ROB.   Brock Bad, MD  05/14/21

## 2021-06-11 ENCOUNTER — Encounter: Payer: Medicaid Other | Admitting: Obstetrics & Gynecology

## 2021-06-11 ENCOUNTER — Encounter: Payer: Medicaid Other | Admitting: Licensed Clinical Social Worker

## 2021-06-14 ENCOUNTER — Other Ambulatory Visit: Payer: Self-pay

## 2021-06-14 ENCOUNTER — Other Ambulatory Visit: Payer: Self-pay | Admitting: *Deleted

## 2021-06-14 ENCOUNTER — Ambulatory Visit: Payer: Medicaid Other | Admitting: *Deleted

## 2021-06-14 ENCOUNTER — Ambulatory Visit: Payer: Medicaid Other | Attending: Obstetrics and Gynecology

## 2021-06-14 VITALS — BP 126/81 | HR 104

## 2021-06-14 DIAGNOSIS — F1210 Cannabis abuse, uncomplicated: Secondary | ICD-10-CM

## 2021-06-14 DIAGNOSIS — Z3A24 24 weeks gestation of pregnancy: Secondary | ICD-10-CM | POA: Insufficient documentation

## 2021-06-14 DIAGNOSIS — O99322 Drug use complicating pregnancy, second trimester: Secondary | ICD-10-CM | POA: Diagnosis not present

## 2021-06-14 DIAGNOSIS — F1290 Cannabis use, unspecified, uncomplicated: Secondary | ICD-10-CM | POA: Diagnosis not present

## 2021-06-14 DIAGNOSIS — O289 Unspecified abnormal findings on antenatal screening of mother: Secondary | ICD-10-CM | POA: Diagnosis present

## 2021-06-14 DIAGNOSIS — O288 Other abnormal findings on antenatal screening of mother: Secondary | ICD-10-CM | POA: Diagnosis not present

## 2021-06-14 DIAGNOSIS — Z741 Need for assistance with personal care: Secondary | ICD-10-CM

## 2021-06-14 DIAGNOSIS — R772 Abnormality of alphafetoprotein: Secondary | ICD-10-CM

## 2021-06-14 DIAGNOSIS — Z3480 Encounter for supervision of other normal pregnancy, unspecified trimester: Secondary | ICD-10-CM | POA: Insufficient documentation

## 2021-06-14 DIAGNOSIS — Z3689 Encounter for other specified antenatal screening: Secondary | ICD-10-CM

## 2021-06-18 ENCOUNTER — Institutional Professional Consult (permissible substitution): Payer: Medicaid Other | Admitting: Physician Assistant

## 2021-06-21 ENCOUNTER — Other Ambulatory Visit: Payer: Self-pay

## 2021-06-21 ENCOUNTER — Ambulatory Visit (INDEPENDENT_AMBULATORY_CARE_PROVIDER_SITE_OTHER): Payer: Medicaid Other | Admitting: Advanced Practice Midwife

## 2021-06-21 VITALS — BP 116/70 | HR 86 | Wt 175.0 lb

## 2021-06-21 DIAGNOSIS — O210 Mild hyperemesis gravidarum: Secondary | ICD-10-CM

## 2021-06-21 DIAGNOSIS — R772 Abnormality of alphafetoprotein: Secondary | ICD-10-CM

## 2021-06-21 DIAGNOSIS — Z3A25 25 weeks gestation of pregnancy: Secondary | ICD-10-CM

## 2021-06-21 DIAGNOSIS — O0990 Supervision of high risk pregnancy, unspecified, unspecified trimester: Secondary | ICD-10-CM

## 2021-06-21 MED ORDER — ONDANSETRON 4 MG PO TBDP: 4.0000 mg | ORAL_TABLET | Freq: Four times a day (QID) | ORAL | 0 refills | Status: DC | PRN

## 2021-06-21 MED ORDER — SCOPOLAMINE 1 MG/3DAYS TD PT72: 1.0000 | MEDICATED_PATCH | TRANSDERMAL | 12 refills | Status: DC

## 2021-06-21 NOTE — Progress Notes (Signed)
? ?  PRENATAL VISIT NOTE ? ?Subjective:  ?Christie Reyes is a 26 y.o. G1P0 at [redacted]w[redacted]d being seen today for ongoing prenatal care.  She is currently monitored for the following issues for this high-risk pregnancy and has Migraine; ASCUS with positive high risk HPV cervical; Multiple food allergies; PCOS (polycystic ovarian syndrome); Supervision of other normal pregnancy, antepartum; Herpes zoster without complication; Marijuana use; and Blister head, sequela on their problem list. ? ?Patient reports  worries about EFW at 20% and vomiting several times daily continues .  Contractions: Not present. Vag. Bleeding: None.  Movement: Present. Denies leaking of fluid.  ? ?The following portions of the patient's history were reviewed and updated as appropriate: allergies, current medications, past family history, past medical history, past social history, past surgical history and problem list.  ? ?Objective:  ? ?Vitals:  ? 06/21/21 1416  ?BP: 116/70  ?Pulse: 86  ?Weight: 175 lb (79.4 kg)  ? ? ?Fetal Status: Fetal Heart Rate (bpm): 138 Fundal Height: 25 cm Movement: Present    ? ?General:  Alert, oriented and cooperative. Patient is in no acute distress.  ?Skin: Skin is warm and dry. No rash noted.   ?Cardiovascular: Normal heart rate noted  ?Respiratory: Normal respiratory effort, no problems with respiration noted  ?Abdomen: Soft, gravid, appropriate for gestational age.  Pain/Pressure: Present     ?Pelvic: Cervical exam deferred        ?Extremities: Normal range of motion.  Edema: None  ?Mental Status: Normal mood and affect. Normal behavior. Normal judgment and thought content.  ? ?Assessment and Plan:  ?Pregnancy: G1P0 at [redacted]w[redacted]d ?1. Supervision of high risk pregnancy, antepartum ?--Anticipatory guidance about next visits/weeks of pregnancy given.  ?--FH wnl today at 25, EFW by [place] 20% with follow up scheduled ?--Next visit in 3 weeks for GTT ? ?2. Abnormal alpha fetoprotein (AFP) level ?--No neural tube defects on [place], MFM  recommends growth [place] and antenatal testing at 32 weeks ?--EFW 37 % on 2/6 then down to 20% on 3/13.  Pt concerned.  F/U US scheduled. ? ? ?3. [redacted] weeks gestation of pregnancy ? ?4. Hyperemesis gravidarum ?--May be contributing to small EFW, also pt is petite so may be wnl ?--Will work on keeping down more food/fluids before next [place] ?--Discussed dietary changes, add Zofran and renew Rx for scopolamine patch to new pharmacy.  ? ?- ondansetron (ZOFRAN-ODT) 4 MG disintegrating tablet; Take 1 tablet (4 mg total) by mouth every 6 (six) hours as needed for nausea.  Dispense: 20 tablet; Refill: 0 ?- scopolamine (TRANSDERM-SCOP) 1 MG/3DAYS; Place 1 patch (1.5 mg total) onto the skin every 3 (three) days.  Dispense: 10 patch; Refill: 12  ? ?Preterm labor symptoms and general obstetric precautions including but not limited to vaginal bleeding, contractions, leaking of fluid and fetal movement were reviewed in detail with the patient. ?Please refer to After Visit Summary for other counseling recommendations.  ? ?Return in about 3 weeks (around 07/12/2021) for HROB, Pt prefers to see me if possible, needs GTT. ? ?Future Appointments  ?Date Time Provider [place]  ?07/15/2021  8:15 AM [facility] NURSE [facility]  ?07/15/2021  8:30 AM [facility] US2 [facility]  ?07/19/2021  1:30 PM [place]-Kirby, Kathie Dike, CNM [facility] None  ? ? ?Fatima Blank, CNM  ?

## 2021-07-15 ENCOUNTER — Ambulatory Visit: Payer: Medicaid Other | Attending: Obstetrics

## 2021-07-15 ENCOUNTER — Encounter: Payer: Self-pay | Admitting: *Deleted

## 2021-07-15 ENCOUNTER — Other Ambulatory Visit: Payer: Self-pay | Admitting: *Deleted

## 2021-07-15 ENCOUNTER — Ambulatory Visit: Payer: Medicaid Other | Admitting: *Deleted

## 2021-07-15 VITALS — BP 120/63 | HR 83

## 2021-07-15 DIAGNOSIS — Z3A28 28 weeks gestation of pregnancy: Secondary | ICD-10-CM

## 2021-07-15 DIAGNOSIS — R772 Abnormality of alphafetoprotein: Secondary | ICD-10-CM | POA: Insufficient documentation

## 2021-07-15 DIAGNOSIS — O288 Other abnormal findings on antenatal screening of mother: Secondary | ICD-10-CM | POA: Diagnosis not present

## 2021-07-15 DIAGNOSIS — O99323 Drug use complicating pregnancy, third trimester: Secondary | ICD-10-CM | POA: Diagnosis not present

## 2021-07-15 DIAGNOSIS — F1210 Cannabis abuse, uncomplicated: Secondary | ICD-10-CM | POA: Diagnosis not present

## 2021-07-15 DIAGNOSIS — Z3689 Encounter for other specified antenatal screening: Secondary | ICD-10-CM

## 2021-07-15 DIAGNOSIS — O99320 Drug use complicating pregnancy, unspecified trimester: Secondary | ICD-10-CM

## 2021-07-19 ENCOUNTER — Ambulatory Visit (INDEPENDENT_AMBULATORY_CARE_PROVIDER_SITE_OTHER): Payer: Medicaid Other | Admitting: Advanced Practice Midwife

## 2021-07-19 VITALS — BP 119/74 | HR 96 | Wt 180.0 lb

## 2021-07-19 DIAGNOSIS — L03011 Cellulitis of right finger: Secondary | ICD-10-CM

## 2021-07-19 DIAGNOSIS — O99613 Diseases of the digestive system complicating pregnancy, third trimester: Secondary | ICD-10-CM

## 2021-07-19 DIAGNOSIS — K5900 Constipation, unspecified: Secondary | ICD-10-CM

## 2021-07-19 DIAGNOSIS — Z3A29 29 weeks gestation of pregnancy: Secondary | ICD-10-CM

## 2021-07-19 DIAGNOSIS — R772 Abnormality of alphafetoprotein: Secondary | ICD-10-CM

## 2021-07-19 DIAGNOSIS — O210 Mild hyperemesis gravidarum: Secondary | ICD-10-CM

## 2021-07-19 DIAGNOSIS — O0990 Supervision of high risk pregnancy, unspecified, unspecified trimester: Secondary | ICD-10-CM

## 2021-07-19 MED ORDER — POLYETHYLENE GLYCOL 3350 17 GM/SCOOP PO POWD: 17.0000 g | Freq: Every day | ORAL | 0 refills | Status: DC

## 2021-07-19 MED ORDER — PROMETHAZINE HCL 12.5 MG PO TABS
12.5000 mg | ORAL_TABLET | Freq: Every evening | ORAL | 2 refills | Status: DC | PRN
Start: 2021-07-19 — End: 2021-09-21

## 2021-07-19 MED ORDER — ONDANSETRON 4 MG PO TBDP: 4.0000 mg | ORAL_TABLET | Freq: Four times a day (QID) | ORAL | 3 refills | Status: DC | PRN

## 2021-07-19 MED ORDER — DOCUSATE SODIUM 100 MG PO CAPS: 100.0000 mg | ORAL_CAPSULE | Freq: Two times a day (BID) | ORAL | 2 refills | Status: DC

## 2021-07-19 MED ORDER — METOCLOPRAMIDE HCL 10 MG PO TABS: 10.0000 mg | ORAL_TABLET | Freq: Three times a day (TID) | ORAL | 2 refills | Status: DC | PRN

## 2021-07-19 MED ORDER — CLINDAMYCIN HCL 300 MG PO CAPS: 300.0000 mg | ORAL_CAPSULE | Freq: Three times a day (TID) | ORAL | 0 refills | Status: AC

## 2021-07-19 NOTE — Progress Notes (Signed)
? ?PRENATAL VISIT NOTE ? ?Subjective:  ?Christie Reyes is a 26 y.o. G1P0 at [redacted]w[redacted]d being seen today for ongoing prenatal care.  She is currently monitored for the following issues for this high-risk pregnancy and has Migraine; ASCUS with positive high risk HPV cervical; Multiple food allergies; PCOS (polycystic ovarian syndrome); Supervision of other normal pregnancy, antepartum; Herpes zoster without complication; Marijuana use; and Blister head, sequela on their problem list. ? ?Patient reports  infection of fingernail/nailbed, constipation .  Contractions: Not present. Vag. Bleeding: None.  Movement: Present. Denies leaking of fluid.  ? ?The following portions of the patient's history were reviewed and updated as appropriate: allergies, current medications, past family history, past medical history, past social history, past surgical history and problem list.  ? ?Objective:  ? ?Vitals:  ? 07/19/21 1338  ?BP: 119/74  ?Pulse: 96  ?Weight: 180 lb (81.6 kg)  ? ? ?Fetal Status: Fetal Heart Rate (bpm): 149   Movement: Present    ? ?General:  Alert, oriented and cooperative. Patient is in no acute distress.  ?Skin: Skin is warm and dry. No rash noted.   ?Cardiovascular: Normal heart rate noted  ?Respiratory: Normal respiratory effort, no problems with respiration noted  ?Abdomen: Soft, gravid, appropriate for gestational age.  Pain/Pressure: Present     ?Pelvic: Cervical exam deferred        ?Extremities: Normal range of motion.  Edema: Trace  ?Mental Status: Normal mood and affect. Normal behavior. Normal judgment and thought content.  ? ?Assessment and Plan:  ?Pregnancy: G1P0 at [redacted]w[redacted]d ?1. Supervision of high risk pregnancy, antepartum ?--Anticipatory guidance about next visits/weeks of pregnancy given.  ?--Not scheduled today for 2 hour, return this week for 2 hour GTT, pt to take nausea medications prior to test ? ?2. Abnormal alpha fetoprotein (AFP) level ?--Normal [place], antenatal testing at 32 weeks ? ? ?3.  Hyperemesis gravidarum ?--Pt with good nausea control with Zofran, but severe constipation. ?--Will add Phenergan for nighttime use, and Reglan to use during the day so pt can use less Zofran. ?--See below for constipation mgmt. ? ?- promethazine (PHENERGAN) 12.5 MG tablet; Take 1-2 tablets (12.5-25 mg total) by mouth at bedtime as needed for nausea or vomiting.  Dispense: 30 tablet; Refill: 2 ?- metoCLOPramide (REGLAN) 10 MG tablet; Take 1 tablet (10 mg total) by mouth 3 (three) times daily with meals as needed for nausea.  Dispense: 60 tablet; Refill: 2 ?- ondansetron (ZOFRAN-ODT) 4 MG disintegrating tablet; Take 1 tablet (4 mg total) by mouth every 6 (six) hours as needed for nausea.  Dispense: 20 tablet; Refill: 3 ? ?4. [redacted] weeks gestation of pregnancy ? ? ?5. Constipation during pregnancy in third trimester ?--Increase fiber and fluid intake, discussed as side effect of Zofran ? ?- docusate sodium (COLACE) 100 MG capsule; Take 1 capsule (100 mg total) by mouth 2 (two) times daily.  Dispense: 10 capsule; Refill: 2 ?- polyethylene glycol powder (GLYCOLAX/MIRALAX) 17 GM/SCOOP powder; Take 17 g by mouth daily.  Dispense: 255 g; Refill: 0 ? ?6. Infection of nail bed of finger of right hand ?--Pt is nail technician and saw Urgent Care x 2, has been putting topical abx cream and soaking BID x 1 week with no improvement. ?--Oral abx recommended, but pt with multiple allergies--will consult ?--After consult with Dr Nehemiah Settle, clindamycin 300 mg TID x 7 days sent to pt pharmacy ?--F/U if not improving in 48 hours ? ?  ? ?Preterm labor symptoms and general obstetric precautions including but  not limited to vaginal bleeding, contractions, leaking of fluid and fetal movement were reviewed in detail with the patient. ?Please refer to After Visit Summary for other counseling recommendations.  ? ?Return in about 2 weeks (around 08/02/2021) for HROB, needs lab only visit ASAP for GTT. ? ?Future Appointments  ?Date Time Provider  [place]  ?08/12/2021  8:15 AM [facility] NURSE [facility]  ?08/12/2021  8:45 AM [facility] US5 [facility]  ?08/19/2021  8:45 AM [facility] NURSE [facility]  ?08/19/2021  9:00 AM [facility] US1 [facility]  ?08/26/2021  8:45 AM [facility] NURSE [facility]  ?08/26/2021  9:00 AM [facility] US1 [facility]  ?09/02/2021  8:45 AM [facility] NURSE [facility]  ?09/02/2021  9:00 AM [facility] US1 [facility]  ?09/09/2021  8:15 AM [facility] NURSE [facility]  ?09/09/2021  8:30 AM [facility] US2 [facility]  ? ? ?Fatima Blank, CNM  ?

## 2021-07-20 ENCOUNTER — Other Ambulatory Visit: Payer: Medicaid Other

## 2021-07-20 DIAGNOSIS — Z3480 Encounter for supervision of other normal pregnancy, unspecified trimester: Secondary | ICD-10-CM

## 2021-07-21 LAB — CBC
Hematocrit: 31.7 % — ABNORMAL LOW (ref 34.0–46.6)
Hemoglobin: 11.2 g/dL (ref 11.1–15.9)
MCH: 32.5 pg (ref 26.6–33.0)
MCHC: 35.3 g/dL (ref 31.5–35.7)
MCV: 92 fL (ref 79–97)
Platelets: 278 10*3/uL (ref 150–450)
RBC: 3.45 x10E6/uL — ABNORMAL LOW (ref 3.77–5.28)
RDW: 11.8 % (ref 11.7–15.4)
WBC: 10.8 10*3/uL (ref 3.4–10.8)

## 2021-07-21 LAB — GLUCOSE TOLERANCE, 2 HOURS W/ 1HR
Glucose, 1 hour: 142 mg/dL (ref 70–179)
Glucose, 2 hour: 124 mg/dL (ref 70–152)
Glucose, Fasting: 83 mg/dL (ref 70–91)

## 2021-07-21 LAB — RPR: RPR Ser Ql: NONREACTIVE

## 2021-07-21 LAB — HIV ANTIBODY (ROUTINE TESTING W REFLEX): HIV Screen 4th Generation wRfx: NONREACTIVE

## 2021-08-02 ENCOUNTER — Ambulatory Visit (INDEPENDENT_AMBULATORY_CARE_PROVIDER_SITE_OTHER): Payer: Medicaid Other | Admitting: Obstetrics and Gynecology

## 2021-08-02 ENCOUNTER — Encounter: Payer: Self-pay | Admitting: Obstetrics and Gynecology

## 2021-08-02 VITALS — BP 135/87 | HR 92 | Wt 179.4 lb

## 2021-08-02 DIAGNOSIS — O280 Abnormal hematological finding on antenatal screening of mother: Secondary | ICD-10-CM

## 2021-08-02 DIAGNOSIS — Z3480 Encounter for supervision of other normal pregnancy, unspecified trimester: Secondary | ICD-10-CM

## 2021-08-02 HISTORY — DX: Abnormal hematological finding on antenatal screening of mother: O28.0

## 2021-08-02 NOTE — Progress Notes (Signed)
Pt reports fetal movement and pelvic pain with walking and when baby moves. ?

## 2021-08-02 NOTE — Patient Instructions (Signed)
AREA PEDIATRIC/FAMILY PRACTICE PHYSICIANS ? ?[place] ([address]) ?[facility] ?Chambliss, MD; Eniola, MD; Hale, MD; Hensel, MD; McDiarmid, MD; McIntyer, MD; Neal, MD; Walden, MD ?[address] [place], [place] [address] ?[phone] ?Mon-Fri 8:30-12:30, 1:30-5:00 ?Providers come to see babies at [facility] ?Accepting Medicaid ?[facility] ?Limited providers who accept newborns: Koirala, MD; Morrow, MD; Wolters, MD ?[address], [place], [place] [address] ?[phone] ?Mon-Fri 8:00-5:30 ?Babies seen by providers at [facility] ?Does NOT accept Medicaid ?Please call early in hospitalization for appointment (limited availability)  ?[facility] ?Mulberry, MD ?[address] [place], [place] [address] ?[phone] ?Mon, Tue, Thur, Fri 8:30-5:00, Wed 10:00-7:00 (closed 1-2pm) ?Babies seen by [facility] providers ?Accepting Medicaid ?[facility] ?Rubin, MD ?[address], [place], [place] [address] ?[phone] ?Mon-Fri 8:30-5:00, Sat 8:30-12:00 ?Provider comes to see babies at [facility] ?Accepting Medicaid ?Must have been referred from current patients or contacted office prior to delivery ?[facility] ([facility]) ?Brown, MD; Chandler, MD; Ettefagh, MD; Grant, MD; Lester, MD; McCormick, MD; McQueen, MD; Prose, MD; Simha, MD; Stanley, MD; Stryffeler, NP; Tebben, NP ?[address], [place], [place] [address] ?[phone] ?Mon, Tue, Thur, Fri 8:30-5:30, Wed 9:30-5:30, Sat 8:30-12:30 ?Babies seen by [facility] providers ?Accepting Medicaid ?Only accepting infants of first-time parents or siblings of current patients ?Hospital discharge coordinator will make follow-up appointment ?Jack Amos ?[address], [place], [place]  [address] ?[phone]   Fax - [phone] ?[facility] ?[address], [place], [place]  [address] ?Phone - [phone]   Fax - [phone] ?Shilpa Gosrani ?[address], [place], [place]  [address] ?[phone] ? ?[place] ([address]) ?[facility] ?Bates, MD; [place]ass[place], MD; Cooper, Cox, MD; MD; Davis, MD; Dovico, MD; Ettefaugh, MD; Little, MD; Lowe, MD; Keiffer, MD; Melvin, MD; Sumner, MD; Williams, MD ?[address], [place], [place] [address] ?[phone] ?Mon-Fri 8:30-5:00 (extended evenings Mon-Thur as needed), Sat-Sun 10:00-1:00 ?Providers come to see babies at [facility] ?Accepting Medicaid for families of first-time babies and families with all children in the household age 3 and under. Must register with office prior to making appointment (M-F only). ?[facility] ?Henson, NP; Knapp, MD; Lalonde, MD; Tysinger, PA ?[address] [place], [place] [address] ?[phone] ?Mon-Fri 8:00-5:00 ?Babies seen by providers at [facility] ?Does NOT accept Medicaid/Commercial Insurance Only ?[facility] ([facility])  ?Artis, MD; Barnes, MD; Bratton, MD; Coccaro, MD; Lockett Gardner, MD; Kramer, MD; Marshall, MD; Netherton, MD; Poleto, MD; Skinner, MD ?[address] [place], [place] [address] ?[phone] ?Mon-Fri 8:30-5:30, Sat (Oct.-Mar.) 9:00-1:00 ?Babies seen by providers at [facility] ?Accepting Medicaid ? ?[place] ([address]) ?[facility] ?Reid, MD; Warner, MD ?[address], [place], [place] [address] ?[phone] ?Mon-Fri 8:30-5:00, Sat 8:30-12:00 ?Providers come to see babies at [facility] ?Does NOT accept Medicaid ?[facility] ?Becker, PA; Hagler, MD; Scifres, PA; Sun, MD; Swayne, MD ?[address], [place], [place] [address] ?[phone] ?Mon-Fri 8:00-5:00 ?Babies seen by providers at [facility] ?Does NOT accept Medicaid ?Only accepting babies of parents who  are patients ?Please call early in hospitalization for appointment (limited availability) ?[facility] ?Clark, MD; Frye, MD; Kelleher, MD; Mack, NP; Miller, MD; O'Keller, MD; Patterson, NP; Pudlo, MD; Puzio, MD; Thomas, MD; Tucker, MD; Twiselton, MD ?[address], [place], [place] [address] ?[phone] ?Mon-Fri 8:00-5:00, Sat 9:00-12:00 ?Providers come to see babies at [facility] ?Does NOT accept Medicaid ? ?[place] ([address]) ?[facility] ?Limited providers accepting new patients: Brake, NP; Wharton, PA ?[address], [place], [place] [address] ?[phone] ?Mon-Fri 8:00-5:00 ?Babies seen by providers at [facility] ?Does NOT accept Medicaid ?Only accepting babies of parents who are patients ?Please call early in hospitalization for appointment (limited availability) ?[facility] ?Gay, MD; Quinlan, MD ?[address] [place], [place] [address] ?[phone] (press [phone] to schedule appointment) ?Mon-Fri 8:00-5:00 ?Providers come to see babies at [facility] ?Does NOT accept Medicaid ?[facility] ?Mazer, MD ?[address] [place], [place] [address] ?[phone] ?Mon-Fri 8:30-5:00 (lunch 12:30-1:00), extended hours by appointment only Wed 5:00-6:30 ?Babies seen by [facility] providers ?Accepting Medicaid ?[facility] ?Banks, MD; Jordan, MD; Koberlein, MD ?[address], [place], [place] [address] ?[phone] ?Mon-Fri 8:00-5:00 ?Babies seen by [facility] providers ?Does NOT accept Medicaid ?[facility] ?Parker, MD; [place], MD; Wallace, DO ?[address] [place], [place] [address] ?[phone] ?Mon-Fri 8:00-5:00 ?Babies seen by [facility] providers ?Does NOT accept Medicaid ?[facility] ?Brandon, PA; Brecken, PA; Christy, NP; Dees, MD; DeClaire, MD; DeWeese, MD; Hansen, NP; [place], NP; Parrish, NP; Smoot, NP; Summer, MD; Vapne,  MD ?[address] [place], [place] [address] ?[phone] ?Mon-Fri 8:30-5:00, Sat 10:00-1:00 ?Providers come to see babies at [facility] ?Does NOT accept Medicaid ?Free prenatal information session Tuesdays at 4:45pm ?[facility] ?Bouska, MD; Gordon, PA; Jeffery, PA; Weber, PA ?[address] [place] [address] ?[phone] ?Mon-Fri 7:30-5:30 ?Babies seen by [facility] providers ?[facility] ?[address], [place], [place]  [address] ?[phone]   Fax - [phone] ? ?[place] ([address]) ?[facility] ?Reese, MD ?[address] [place], [place] [address] ?[phone] ?Mon-Thur 8:00-6:00 ?Providers come to see babies at [facility] ?Accepting Medicaid ?[facility] ?Anderson, NP; Badger, MD; Beal, PA; [place], PA ?[address] [place], [place] [address] ?[phone] ?Mon-Thur 7:30-7:30, Fri 7:30-4:30 ?Babies seen by [facility] providers ?Accepting Medicaid ?[facility] ?Agbuya, MD; Klett, NP; Romgoolam, MD ?[address], [place], [place] [address] ?[phone] ?Mon-Fri 8:30-5:00, Sat 8:30-12:00 ?Providers come to see babies at [facility] ?Accepting Medicaid ?Must have ?Meet & Greet? appointment at office prior to delivery ?[facility] ([facility]) ?McCord, MD; Wallace, MD; Wood, MD ?[address], [place], [place] [address] ?[phone] ?Mon-Wed 8:00-6:00, Thur-Fri 8:00-5:00, Sat 9:00-12:00 ?Providers come to see babies at [facility] ?Does NOT accept Medicaid ?Only accepting siblings of current patients ?[facility]  ?[address], [place], [place]  [address] ?[phone]   Fax - [phone] ?[facility] ?[address], [place], [place]  [address] ?[phone]   Fax -  [phone] ? ?[place] ([address]) ?[facility] ?Cirigliano, DO; Matthews, DO ?[address] [place], [place] [address] ?[phone] ?Mon-Fri 7:00-5:00 ?Babies seen by [facility]

## 2021-08-02 NOTE — Progress Notes (Signed)
? ?  PRENATAL VISIT NOTE ? ?Subjective:  ?Christie Reyes is a 26 y.o. G1P0 at [redacted]w[redacted]d being seen today for ongoing prenatal care.  She is currently monitored for the following issues for this high-risk pregnancy and has Migraine; ASCUS with positive high risk HPV cervical; Multiple food allergies; PCOS (polycystic ovarian syndrome); Supervision of other normal pregnancy, antepartum; Herpes zoster without complication; Marijuana use; Blister head, sequela; and Abnormal antenatal AFP screen on their problem list. ? ?Patient reports no complaints.  Contractions: Not present. Vag. Bleeding: None.  Movement: Present. Denies leaking of fluid.  ? ?The following portions of the patient's history were reviewed and updated as appropriate: allergies, current medications, past family history, past medical history, past social history, past surgical history and problem list.  ? ?Objective:  ? ?Vitals:  ? 08/02/21 1351  ?BP: 135/87  ?Pulse: 92  ?Weight: 179 lb 6.4 oz (81.4 kg)  ? ? ?Fetal Status: Fetal Heart Rate (bpm): 152 Fundal Height: 31 cm Movement: Present    ? ?General:  Alert, oriented and cooperative. Patient is in no acute distress.  ?Skin: Skin is warm and dry. No rash noted.   ?Cardiovascular: Normal heart rate noted  ?Respiratory: Normal respiratory effort, no problems with respiration noted  ?Abdomen: Soft, gravid, appropriate for gestational age.  Pain/Pressure: Present     ?Pelvic: Cervical exam deferred        ?Extremities: Normal range of motion.  Edema: Trace  ?Mental Status: Normal mood and affect. Normal behavior. Normal judgment and thought content.  ? ?Assessment and Plan:  ?Pregnancy: G1P0 at [redacted]w[redacted]d ?1. Supervision of other normal pregnancy, antepartum ?PAtient is doing well without complaints ?Peds list provided ?Patient undecided on contraception ? ?2. Abnormal antenatal AFP screen ?Normal ultrasound ?Antenatal testing starting next week ? ?Preterm labor symptoms and general obstetric precautions including  but not limited to vaginal bleeding, contractions, leaking of fluid and fetal movement were reviewed in detail with the patient. ?Please refer to After Visit Summary for other counseling recommendations.  ? ?Return in about 2 weeks (around 08/16/2021) for in person, ROB, High risk. ? ?Future Appointments  ?Date Time Provider [place]  ?08/12/2021  8:15 AM [facility] NURSE [facility]  ?08/12/2021  8:45 AM [facility] US5 [facility]  ?08/19/2021  8:45 AM [facility] NURSE [facility]  ?08/19/2021  9:00 AM [facility] US1 [facility]  ?08/26/2021  8:45 AM [facility] NURSE [facility]  ?08/26/2021  9:00 AM [facility] US1 [facility]  ?09/02/2021  8:45 AM [facility] NURSE [facility]  ?09/02/2021  9:00 AM [facility] US1 [facility]  ?09/09/2021  8:15 AM [facility] NURSE [facility]  ?09/09/2021  8:30 AM [facility] US2 [facility]  ? ? ?Mora Bellman, MD ? ?

## 2021-08-12 ENCOUNTER — Other Ambulatory Visit: Payer: Self-pay | Admitting: Maternal & Fetal Medicine

## 2021-08-12 ENCOUNTER — Encounter: Payer: Self-pay | Admitting: *Deleted

## 2021-08-12 ENCOUNTER — Ambulatory Visit: Payer: Medicaid Other | Admitting: *Deleted

## 2021-08-12 ENCOUNTER — Ambulatory Visit: Payer: Medicaid Other | Attending: Maternal & Fetal Medicine

## 2021-08-12 VITALS — BP 121/62 | HR 100

## 2021-08-12 DIAGNOSIS — O99323 Drug use complicating pregnancy, third trimester: Secondary | ICD-10-CM | POA: Diagnosis not present

## 2021-08-12 DIAGNOSIS — F1290 Cannabis use, unspecified, uncomplicated: Secondary | ICD-10-CM | POA: Diagnosis not present

## 2021-08-12 DIAGNOSIS — Z3A32 32 weeks gestation of pregnancy: Secondary | ICD-10-CM

## 2021-08-12 DIAGNOSIS — R772 Abnormality of alphafetoprotein: Secondary | ICD-10-CM

## 2021-08-12 DIAGNOSIS — Z3689 Encounter for other specified antenatal screening: Secondary | ICD-10-CM | POA: Insufficient documentation

## 2021-08-12 DIAGNOSIS — F1210 Cannabis abuse, uncomplicated: Secondary | ICD-10-CM | POA: Diagnosis present

## 2021-08-12 DIAGNOSIS — O99320 Drug use complicating pregnancy, unspecified trimester: Secondary | ICD-10-CM | POA: Diagnosis present

## 2021-08-12 DIAGNOSIS — O280 Abnormal hematological finding on antenatal screening of mother: Secondary | ICD-10-CM | POA: Diagnosis not present

## 2021-08-12 NOTE — Procedures (Signed)
Christie Reyes ?08/26/1995 ?[redacted]w[redacted]d ? ?Fetus A Non-Stress Test Interpretation for 08/12/21 ? ?Indication: Unsatisfactory BPP ? ?Fetal Heart Rate A ?Mode: External ?Baseline Rate (A): 125 bpm ?Variability: Moderate ?Accelerations: 15 x 15 ?Decelerations: None ?Multiple birth?: No ? ?Uterine Activity ?Mode: Toco ?Contraction Frequency (min): none ?Resting Tone Palpated: Relaxed ? ?Interpretation (Fetal Testing) ?Nonstress Test Interpretation: Reactive ?Overall Impression: Reassuring for gestational age ?Comments: tracing reviewed byDr. Donalee Citrin ? ? ?

## 2021-08-16 ENCOUNTER — Encounter: Payer: Medicaid Other | Admitting: Advanced Practice Midwife

## 2021-08-19 ENCOUNTER — Ambulatory Visit: Payer: Medicaid Other | Attending: Maternal & Fetal Medicine

## 2021-08-19 ENCOUNTER — Ambulatory Visit: Payer: Medicaid Other | Admitting: *Deleted

## 2021-08-19 ENCOUNTER — Encounter: Payer: Self-pay | Admitting: *Deleted

## 2021-08-19 VITALS — BP 124/71 | HR 88

## 2021-08-19 DIAGNOSIS — Z3689 Encounter for other specified antenatal screening: Secondary | ICD-10-CM | POA: Insufficient documentation

## 2021-08-19 DIAGNOSIS — O99320 Drug use complicating pregnancy, unspecified trimester: Secondary | ICD-10-CM

## 2021-08-19 DIAGNOSIS — O280 Abnormal hematological finding on antenatal screening of mother: Secondary | ICD-10-CM | POA: Diagnosis not present

## 2021-08-19 DIAGNOSIS — R772 Abnormality of alphafetoprotein: Secondary | ICD-10-CM | POA: Insufficient documentation

## 2021-08-19 DIAGNOSIS — F1290 Cannabis use, unspecified, uncomplicated: Secondary | ICD-10-CM | POA: Insufficient documentation

## 2021-08-19 DIAGNOSIS — Z3A33 33 weeks gestation of pregnancy: Secondary | ICD-10-CM

## 2021-08-23 ENCOUNTER — Ambulatory Visit (INDEPENDENT_AMBULATORY_CARE_PROVIDER_SITE_OTHER): Payer: Medicaid Other | Admitting: Advanced Practice Midwife

## 2021-08-23 VITALS — BP 137/87 | HR 104 | Wt 186.2 lb

## 2021-08-23 DIAGNOSIS — O0990 Supervision of high risk pregnancy, unspecified, unspecified trimester: Secondary | ICD-10-CM

## 2021-08-23 DIAGNOSIS — B0089 Other herpesviral infection: Secondary | ICD-10-CM

## 2021-08-23 DIAGNOSIS — O280 Abnormal hematological finding on antenatal screening of mother: Secondary | ICD-10-CM

## 2021-08-23 DIAGNOSIS — R772 Abnormality of alphafetoprotein: Secondary | ICD-10-CM

## 2021-08-23 DIAGNOSIS — Z3A34 34 weeks gestation of pregnancy: Secondary | ICD-10-CM

## 2021-08-23 MED ORDER — VALACYCLOVIR HCL 1 G PO TABS: 1000.0000 mg | ORAL_TABLET | Freq: Two times a day (BID) | ORAL | 0 refills | Status: AC

## 2021-08-23 MED ORDER — VALACYCLOVIR HCL 1 G PO TABS: 1000.0000 mg | ORAL_TABLET | Freq: Every day | ORAL | 2 refills | Status: AC

## 2021-08-23 NOTE — Progress Notes (Signed)
Pt reports fetal movement with some pelvic pressure. 

## 2021-08-23 NOTE — Progress Notes (Signed)
   PRENATAL VISIT NOTE  Subjective:  Christie Reyes is a 26 y.o. G1P0 at [redacted]w[redacted]d being seen today for ongoing prenatal care.  She is currently monitored for the following issues for this low-risk pregnancy and has Migraine; ASCUS with positive high risk HPV cervical; Multiple food allergies; PCOS (polycystic ovarian syndrome); Supervision of other normal pregnancy, antepartum; Herpes zoster without complication; Marijuana use; Blister head, sequela; and Abnormal antenatal AFP screen on their problem list.  Patient reports intermittent pelvic pressure and painful lesion on finger of right hand.  Contractions: Not present. Vag. Bleeding: None.  Movement: Present. Denies leaking of fluid.   The following portions of the patient's history were reviewed and updated as appropriate: allergies, current medications, past family history, past medical history, past social history, past surgical history and problem list.   Objective:   Vitals:   08/23/21 1421  BP: 137/87  Pulse: (!) 104  Weight: 186 lb 3.2 oz (84.5 kg)    Fetal Status: Fetal Heart Rate (bpm): 140   Movement: Present     General:  Alert, oriented and cooperative. Patient is in no acute distress.  Skin: Skin is warm and dry. No rash noted.   Cardiovascular: Normal heart rate noted  Respiratory: Normal respiratory effort, no problems with respiration noted  Abdomen: Soft, gravid, appropriate for gestational age.  Pain/Pressure: Present     Pelvic: Cervical exam deferred        Extremities: Normal range of motion.  Edema: Trace  Mental Status: Normal mood and affect. Normal behavior. Normal judgment and thought content.   Assessment and Plan:  Pregnancy: G1P0 at [redacted]w[redacted]d 1. Abnormal antenatal AFP screen --High MSAFP, unknown reason.  Weekly BPPs per MFM and IOL around 39 weeks.  2. Supervision of high risk pregnancy, antepartum --Anticipatory guidance about next visits/weeks of pregnancy given.  3. Abnormal alpha fetoprotein (AFP)  level   4. [redacted] weeks gestation of pregnancy   5. Herpetic whitlow --Lesion on finger treated with soaks in epsom salt, topical antibiotics, and oral antibiotics without improvement.  Lesion c/w herpetic lesion, with crusted appearance of raised red lesion and intermittent bleeding. - valACYclovir (VALTREX) 1000 MG tablet; Take 1 tablet (1,000 mg total) by mouth 2 (two) times daily for 7 days.  Dispense: 14 tablet; Refill: 0 - valACYclovir (VALTREX) 1000 MG tablet; Take 1 tablet (1,000 mg total) by mouth daily for 5 days. For recurrent infections after primary outbreak has healed.  Dispense: 5 tablet; Refill: 2   Preterm labor symptoms and general obstetric precautions including but not limited to vaginal bleeding, contractions, leaking of fluid and fetal movement were reviewed in detail with the patient. Please refer to After Visit Summary for other counseling recommendations.   No follow-ups on file.  Future Appointments  Date Time Provider Department Center  08/26/2021  8:45 AM [facility] NURSE [facility]  08/26/2021  9:00 AM [facility] US1 [facility]  09/02/2021  8:45 AM [facility] NURSE [facility]  09/02/2021  9:00 AM [facility] US1 [facility]  09/09/2021  8:15 AM [facility] NURSE [facility]  09/09/2021  8:30 AM [facility] US2 [facility]    Sharen Counter, CNM

## 2021-08-24 ENCOUNTER — Encounter: Payer: Self-pay | Admitting: Advanced Practice Midwife

## 2021-08-26 ENCOUNTER — Ambulatory Visit: Payer: Medicaid Other | Attending: Obstetrics and Gynecology

## 2021-08-26 ENCOUNTER — Encounter: Payer: Self-pay | Admitting: *Deleted

## 2021-08-26 ENCOUNTER — Ambulatory Visit: Payer: Medicaid Other | Admitting: *Deleted

## 2021-08-26 VITALS — BP 130/74 | HR 102

## 2021-08-26 DIAGNOSIS — O99320 Drug use complicating pregnancy, unspecified trimester: Secondary | ICD-10-CM | POA: Diagnosis not present

## 2021-08-26 DIAGNOSIS — R772 Abnormality of alphafetoprotein: Secondary | ICD-10-CM | POA: Insufficient documentation

## 2021-08-26 DIAGNOSIS — O281 Abnormal biochemical finding on antenatal screening of mother: Secondary | ICD-10-CM | POA: Diagnosis not present

## 2021-08-26 DIAGNOSIS — F1290 Cannabis use, unspecified, uncomplicated: Secondary | ICD-10-CM | POA: Diagnosis not present

## 2021-08-26 DIAGNOSIS — Z3689 Encounter for other specified antenatal screening: Secondary | ICD-10-CM | POA: Insufficient documentation

## 2021-08-26 DIAGNOSIS — Z3A33 33 weeks gestation of pregnancy: Secondary | ICD-10-CM

## 2021-08-26 LAB — HERPES SIMPLEX VIRUS CULTURE

## 2021-08-30 ENCOUNTER — Encounter (HOSPITAL_COMMUNITY): Payer: Self-pay | Admitting: Family Medicine

## 2021-08-30 ENCOUNTER — Other Ambulatory Visit: Payer: Self-pay

## 2021-08-30 ENCOUNTER — Inpatient Hospital Stay (HOSPITAL_COMMUNITY)
Admission: AD | Admit: 2021-08-30 | Discharge: 2021-08-30 | Disposition: A | Discharge status: Home / Self Care | Payer: Medicaid Other | Attending: Family Medicine | Admitting: Family Medicine

## 2021-08-30 DIAGNOSIS — O42913 Preterm premature rupture of membranes, unspecified as to length of time between rupture and onset of labor, third trimester: Secondary | ICD-10-CM | POA: Diagnosis not present

## 2021-08-30 DIAGNOSIS — O26893 Other specified pregnancy related conditions, third trimester: Secondary | ICD-10-CM | POA: Insufficient documentation

## 2021-08-30 DIAGNOSIS — Z3A35 35 weeks gestation of pregnancy: Secondary | ICD-10-CM | POA: Diagnosis not present

## 2021-08-30 DIAGNOSIS — N949 Unspecified condition associated with female genital organs and menstrual cycle: Secondary | ICD-10-CM | POA: Diagnosis not present

## 2021-08-30 DIAGNOSIS — R102 Pelvic and perineal pain: Secondary | ICD-10-CM | POA: Diagnosis not present

## 2021-08-30 DIAGNOSIS — N898 Other specified noninflammatory disorders of vagina: Secondary | ICD-10-CM | POA: Insufficient documentation

## 2021-08-30 DIAGNOSIS — Z0371 Encounter for suspected problem with amniotic cavity and membrane ruled out: Secondary | ICD-10-CM

## 2021-08-30 LAB — WET PREP, GENITAL
Clue Cells Wet Prep HPF POC: NONE SEEN
Sperm: NONE SEEN
Trich, Wet Prep: NONE SEEN
WBC, Wet Prep HPF POC: >=10 — AB (ref <10)
Yeast Wet Prep HPF POC: NONE SEEN

## 2021-08-30 LAB — AMNISURE RUPTURE OF MEMBRANE (ROM) NOT AT ARMC: Amnisure ROM: NEGATIVE

## 2021-08-30 NOTE — MAU Note (Signed)
Christie Reyes is a 26 y.o. at [redacted]w[redacted]d here in MAU reporting: about 0500 had some clear fluid, watery come out, has continued through the day. Has been having pain both side and pelvic pressure, lasts for a minute and goes away for 5.  Has continued.  Never had pains like this.   Onset of complaint: 0500 Pain score: 8 Vitals:   08/30/21 1557  BP: 120/79  Pulse: 96  Resp: 20  Temp: 98.1 F (36.7 C)  SpO2: 100%     FHT:126 Lab orders placed from triage:

## 2021-08-30 NOTE — MAU Provider Note (Signed)
History     CSN: [id number]  Arrival date and time: 08/30/21 1528   Event Date/Time   First Provider Initiated Contact with Patient 08/30/21 1638      Chief Complaint  Patient presents with   Rupture of Membranes   Contractions   Christie Reyes is a 26 y.o. G1P0 at 38w0dwho presents today with abdominal pain and leaking fluid. She states that she has been leaking fluid for about one week. She reports that she has been having bilateral lower abdominal pain since around 0500 today. She denies any VB. She reports normal fetal movement.   Pelvic Pain The patient's primary symptoms include pelvic pain and vaginal discharge. This is a new problem. The current episode started today. The problem occurs intermittently. The problem has been unchanged. The problem affects both sides. She is pregnant. The vaginal discharge was watery. There has been no bleeding. Nothing aggravates the symptoms. She has tried nothing for the symptoms.   OB History     Gravida  1   Para      Term      Preterm      AB      Living         SAB      IAB      Ectopic      Multiple      Live Births              Past Medical History:  Diagnosis Date   Acute pyelonephritis 01/16/2019   Anxiety    Depression    Migraine    PCOS (polycystic ovarian syndrome)    Renal stones     Past Surgical History:  Procedure Laterality Date   adnoidectomy and tonsilectomy     TONSILLECTOMY AND ADENOIDECTOMY      Family History  Problem Relation Age of Onset   Diabetes Mother    Cancer Father    Diabetes Father    Diabetes Paternal Grandmother     Social History   Tobacco Use   Smoking status: Never   Smokeless tobacco: Never  Vaping Use   Vaping Use: Never used  Substance Use Topics   Alcohol use: No   Drug use: Not Currently    Types: Marijuana    Comment: Last smoked jan 2023    Allergies:  Allergies  Allergen Reactions   Cefdinir Anaphylaxis   Eggs Or Egg-Derived Products  Anaphylaxis   Elemental Sulfur Shortness Of Breath and Swelling   Other Anaphylaxis, Cough and Rash    Dustmites, cats   Cat Hair Extract    Influenza Vac Typ  [Influenza Virus Vaccine]     Allergy to eggs   Sulfa Antibiotics    Sulfamethoxazole Swelling    Medications Prior to Admission  Medication Sig Dispense Refill Last Dose   valACYclovir (VALTREX) 1000 MG tablet Take 1 tablet (1,000 mg total) by mouth 2 (two) times daily for 7 days. 14 tablet 0 08/30/2021 at 0600   Blood Pressure Monitoring (BLOOD PRESSURE KIT) DEVI 1 Device by Does not apply route once a week. (Patient not taking: Reported on 08/23/2021) 1 each 0    clindamycin (CLEOCIN) 300 MG capsule Take 300 mg by mouth 3 (three) times daily. Pt reports taking medication 2 times per day (Patient not taking: Reported on 08/12/2021)      docusate sodium (COLACE) 100 MG capsule Take 1 capsule (100 mg total) by mouth 2 (two) times daily. (Patient not taking: Reported on  08/12/2021) 10 capsule 2    metoCLOPramide (REGLAN) 10 MG tablet Take 1 tablet (10 mg total) by mouth 3 (three) times daily with meals as needed for nausea. (Patient not taking: Reported on 08/23/2021) 60 tablet 2    metroNIDAZOLE (METROGEL VAGINAL) 0.75 % vaginal gel Place 1 Applicatorful vaginally at bedtime. Insert one applicator, at bedtime, for 5 nights. (Patient not taking: Reported on 08/02/2021) 70 g 0    Misc. Devices (GOJJI WEIGHT SCALE) MISC 1 Device by Does not apply route once a week. (Patient not taking: Reported on 06/14/2021) 1 each 0    ondansetron (ZOFRAN-ODT) 4 MG disintegrating tablet Take 1 tablet (4 mg total) by mouth every 6 (six) hours as needed for nausea. (Patient not taking: Reported on 08/23/2021) 20 tablet 3    polyethylene glycol powder (GLYCOLAX/MIRALAX) 17 GM/SCOOP powder Take 17 g by mouth daily. (Patient not taking: Reported on 08/12/2021) 255 g 0    Prenatal Vit-Fe Fumarate-FA (PREPLUS) 27-1 MG TABS Take 1 tablet by mouth daily. (Patient not  taking: Reported on 08/12/2021) 30 tablet 13    promethazine (PHENERGAN) 12.5 MG tablet Take 1-2 tablets (12.5-25 mg total) by mouth at bedtime as needed for nausea or vomiting. (Patient not taking: Reported on 08/23/2021) 30 tablet 2    scopolamine (TRANSDERM-SCOP) 1 MG/3DAYS Place 1 patch (1.5 mg total) onto the skin every 3 (three) days. (Patient not taking: Reported on 08/23/2021) 10 patch 12     Review of Systems  Genitourinary:  Positive for pelvic pain and vaginal discharge.  All other systems reviewed and are negative. Physical Exam   Blood pressure 123/74, pulse 91, temperature 98.1 F (36.7 C), temperature source Oral, resp. rate 20, height _0  (1.6 m), weight 84.7 kg, SpO2 99 %.  Physical Exam Constitutional:      Appearance: She is well-developed.  HENT:     Head: Normocephalic.  Eyes:     Pupils: Pupils are equal, round, and reactive to light.  Cardiovascular:     Rate and Rhythm: Normal rate and regular rhythm.     Heart sounds: Normal heart sounds.  Pulmonary:     Effort: Pulmonary effort is normal. No respiratory distress.     Breath sounds: Normal breath sounds.  Abdominal:     Palpations: Abdomen is soft.     Tenderness: There is no abdominal tenderness.  Genitourinary:    Vagina: No bleeding. Vaginal discharge: mucusy.    Comments: External: no lesion Vagina: small amount of white discharge Dilation: Closed Exam by:: Carlton Adam, CNM   Musculoskeletal:        General: Normal range of motion.     Cervical back: Normal range of motion and neck supple.  Skin:    General: Skin is warm and dry.  Neurological:     Mental Status: She is alert and oriented to person, place, and time.  Psychiatric:        Mood and Affect: Mood normal.        Behavior: Behavior normal.     NST:  Baseline: 135 Variability: moderate Accels: 15x15 Decels: none Toco: none Reactive/Appropriate for GA   Results for orders placed or performed during the hospital encounter of  08/30/21 (from the past 24 hour(s))  Amnisure rupture of membrane (rom)not at [facility]     Status: None   Collection Time: 08/30/21  5:12 PM  Result Value Ref Range   Amnisure ROM NEGATIVE   Wet prep, genital     Status: Abnormal  Collection Time: 08/30/21  5:12 PM   Specimen: Vaginal  Result Value Ref Range   Yeast Wet Prep HPF POC NONE SEEN NONE SEEN   Trich, Wet Prep NONE SEEN NONE SEEN   Clue Cells Wet Prep HPF POC NONE SEEN NONE SEEN   WBC, Wet Prep HPF POC >=10 (A) <10   Sperm NONE SEEN      MAU Course  Procedures  MDM   Assessment and Plan   1. Round ligament pain   2. Vaginal discharge during pregnancy in third trimester   3. Encounter for suspected premature rupture of membranes, with rupture of membranes not found   4. [redacted] weeks gestation of pregnancy    DC home in stable condition  3rd Trimester precautions  PTL precautions  Fetal kick counts RX: no new RX  Return to MAU as needed FU with OB as planned   Follow-up Information     [facility].   Contact information: [address] [place] [phone] [place] DNP, CNM  08/30/21  6:21 PM

## 2021-09-02 ENCOUNTER — Telehealth: Payer: Self-pay | Admitting: *Deleted

## 2021-09-02 ENCOUNTER — Ambulatory Visit: Payer: Medicaid Other | Attending: Maternal & Fetal Medicine

## 2021-09-02 ENCOUNTER — Other Ambulatory Visit: Payer: Self-pay | Admitting: *Deleted

## 2021-09-02 ENCOUNTER — Other Ambulatory Visit: Payer: Self-pay | Admitting: Advanced Practice Midwife

## 2021-09-02 ENCOUNTER — Ambulatory Visit: Payer: Medicaid Other | Admitting: *Deleted

## 2021-09-02 VITALS — BP 127/71 | HR 90

## 2021-09-02 DIAGNOSIS — O281 Abnormal biochemical finding on antenatal screening of mother: Secondary | ICD-10-CM

## 2021-09-02 DIAGNOSIS — L03011 Cellulitis of right finger: Secondary | ICD-10-CM

## 2021-09-02 DIAGNOSIS — R772 Abnormality of alphafetoprotein: Secondary | ICD-10-CM | POA: Diagnosis not present

## 2021-09-02 DIAGNOSIS — Z3A35 35 weeks gestation of pregnancy: Secondary | ICD-10-CM

## 2021-09-02 DIAGNOSIS — Z3689 Encounter for other specified antenatal screening: Secondary | ICD-10-CM | POA: Insufficient documentation

## 2021-09-02 DIAGNOSIS — O99320 Drug use complicating pregnancy, unspecified trimester: Secondary | ICD-10-CM | POA: Insufficient documentation

## 2021-09-02 DIAGNOSIS — F1290 Cannabis use, unspecified, uncomplicated: Secondary | ICD-10-CM

## 2021-09-02 DIAGNOSIS — Z7689 Persons encountering health services in other specified circumstances: Secondary | ICD-10-CM

## 2021-09-02 NOTE — Progress Notes (Signed)
Pt sent MyChart message requesting dermatology referral for infection of nailbed on right hand.  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 She has tried soaks and topical treatment as well as oral antibiotics, and most recently, Valtrex for suspected herpetic whitlow.  Pt reports little improvement so referral needed.  I started to put in referral, but see referral placed today by Dr Donavan Foil.  Pt to keep prenatal appts as scheduled.

## 2021-09-02 NOTE — Progress Notes (Signed)
Pt called to office for referral to Dermatology for finger problem/lesion.  Per chart, Misty Stanley had sent treatment gave recommendations last visit. Pt states it is no better.   Referral has been placed per Dr Donavan Foil approval. LVM for pt making aware of referral and she should be seen at urgent care if symptoms are no better or worsening.  Advised to call our office as needed.

## 2021-09-02 NOTE — Telephone Encounter (Signed)
Pt called to office with questions about today's u/s- pt states low fluid. Pt wants to know if plan would change due to thisl Per u/s pt is scheduled for IOL on 6/19, has appt to repeat scan next week. Reviewed with Dr Donavan Foil in office, no change in plan at this time- keep next week u/s appt and will plan accordingly.   Pt made aware.

## 2021-09-03 ENCOUNTER — Encounter (HOSPITAL_COMMUNITY): Payer: Self-pay

## 2021-09-03 ENCOUNTER — Telehealth (HOSPITAL_COMMUNITY): Payer: Self-pay | Admitting: *Deleted

## 2021-09-03 ENCOUNTER — Encounter (HOSPITAL_COMMUNITY): Payer: Self-pay | Admitting: *Deleted

## 2021-09-03 NOTE — Telephone Encounter (Signed)
Preadmission screen  

## 2021-09-09 ENCOUNTER — Ambulatory Visit: Payer: Medicaid Other | Admitting: *Deleted

## 2021-09-09 ENCOUNTER — Encounter: Payer: Self-pay | Admitting: *Deleted

## 2021-09-09 ENCOUNTER — Ambulatory Visit: Payer: Medicaid Other | Attending: Maternal & Fetal Medicine

## 2021-09-09 VITALS — BP 124/72 | HR 95

## 2021-09-09 DIAGNOSIS — R772 Abnormality of alphafetoprotein: Secondary | ICD-10-CM

## 2021-09-09 DIAGNOSIS — F1290 Cannabis use, unspecified, uncomplicated: Secondary | ICD-10-CM | POA: Diagnosis not present

## 2021-09-09 DIAGNOSIS — Z3689 Encounter for other specified antenatal screening: Secondary | ICD-10-CM

## 2021-09-09 DIAGNOSIS — O280 Abnormal hematological finding on antenatal screening of mother: Secondary | ICD-10-CM | POA: Diagnosis not present

## 2021-09-09 DIAGNOSIS — Z3A36 36 weeks gestation of pregnancy: Secondary | ICD-10-CM

## 2021-09-09 DIAGNOSIS — O99320 Drug use complicating pregnancy, unspecified trimester: Secondary | ICD-10-CM | POA: Diagnosis not present

## 2021-09-11 ENCOUNTER — Other Ambulatory Visit: Payer: Self-pay | Admitting: Advanced Practice Midwife

## 2021-09-13 ENCOUNTER — Other Ambulatory Visit (HOSPITAL_COMMUNITY)
Admission: RE | Admit: 2021-09-13 | Discharge: 2021-09-13 | Disposition: A | Discharge status: Home / Self Care | Payer: Medicaid Other | Source: Ambulatory Visit | Attending: Advanced Practice Midwife | Admitting: Advanced Practice Midwife

## 2021-09-13 ENCOUNTER — Ambulatory Visit (INDEPENDENT_AMBULATORY_CARE_PROVIDER_SITE_OTHER): Payer: Medicaid Other | Admitting: Advanced Practice Midwife

## 2021-09-13 VITALS — BP 125/81 | HR 100 | Wt 189.6 lb

## 2021-09-13 DIAGNOSIS — Z3A37 37 weeks gestation of pregnancy: Secondary | ICD-10-CM

## 2021-09-13 DIAGNOSIS — O0990 Supervision of high risk pregnancy, unspecified, unspecified trimester: Secondary | ICD-10-CM

## 2021-09-13 DIAGNOSIS — O280 Abnormal hematological finding on antenatal screening of mother: Secondary | ICD-10-CM

## 2021-09-13 DIAGNOSIS — L03011 Cellulitis of right finger: Secondary | ICD-10-CM

## 2021-09-13 DIAGNOSIS — Z3480 Encounter for supervision of other normal pregnancy, unspecified trimester: Secondary | ICD-10-CM

## 2021-09-13 NOTE — Progress Notes (Signed)
Pt presents for ROB. Induction for abnormal AFP scheduled 6/19. Has no unusual complaints today.  36 week labs today.

## 2021-09-13 NOTE — Patient Instructions (Signed)
Things to Try After 37 weeks to Encourage Labor/Get Ready for Labor:    Try the Miles Circuit at www.milescircuit.com daily to improve baby's position and encourage the onset of labor.  Walk a little and rest a little every day.  Change positions often.  Cervical Ripening: May try one or both Red Raspberry Leaf capsules or tea:  two 300mg or 400mg tablets with each meal, 2-3 times a day, or 1-3 cups of tea daily  Potential Side Effects Of Raspberry Leaf:  Most women do not experience any side effects from drinking raspberry leaf tea. However, nausea and loose stools are possible   Evening Primrose Oil capsules: take 1 capsule by mouth and place one capsule in the vagina every night.    Some of the potential side effects:  Upset stomach  Loose stools or diarrhea  Headaches  Nausea  Sex can also help the cervix ripen and encourage labor onset.    Labor Precautions Reasons to come to [facility]U at [facility]:  1.  Contractions are  5 minutes apart or less, each last 1 minute, these have been going on for 1-2 hours, and you cannot walk or talk during them 2.  You have a large gush of fluid, or a trickle of fluid that will not stop and you have to wear a pad 3.  You have bleeding that is bright red, heavier than spotting--like menstrual bleeding (spotting can be normal in early labor or after a check of your cervix) 4.  You do not feel the baby moving like he/she normally does  

## 2021-09-13 NOTE — Progress Notes (Signed)
   PRENATAL VISIT NOTE  Subjective:  Christie Reyes is a 26 y.o. G1P0 at [redacted]w[redacted]d being seen today for ongoing prenatal care.  She is currently monitored for the following issues for this high-risk pregnancy and has Migraine; ASCUS with positive high risk HPV cervical; Multiple food allergies; PCOS (polycystic ovarian syndrome); Supervision of other normal pregnancy, antepartum; Herpes zoster without complication; Marijuana use; Blister head, sequela; and Abnormal antenatal AFP screen on their problem list.  Patient reports occasional contractions.  Contractions: Irritability. Vag. Bleeding: None.  Movement: Present. Denies leaking of fluid.   The following portions of the patient's history were reviewed and updated as appropriate: allergies, current medications, past family history, past medical history, past social history, past surgical history and problem list.   Objective:   Vitals:   09/13/21 1055  BP: 125/81  Pulse: 100  Weight: 189 lb 9.6 oz (86 kg)    Fetal Status:   Fundal Height: 37 cm Movement: Present  Presentation: Vertex  General:  Alert, oriented and cooperative. Patient is in no acute distress.  Skin: Skin is warm and dry. No rash noted.   Cardiovascular: Normal heart rate noted  Respiratory: Normal respiratory effort, no problems with respiration noted  Abdomen: Soft, gravid, appropriate for gestational age.  Pain/Pressure: Present     Pelvic: Cervical exam performed in the presence of a chaperone Dilation: 1 Effacement (%): 60 Station: -2  Extremities: Normal range of motion.  Edema: Trace  Mental Status: Normal mood and affect. Normal behavior. Normal judgment and thought content.   Assessment and Plan:  Pregnancy: G1P0 at 108w0d 1. Supervision of high risk pregnancy, antepartum --Anticipatory guidance about next visits/weeks of pregnancy given.  --Pt to keep scheduled IOL on 09/20/21, orders in chart  2. Abnormal antenatal AFP screen --elevated AFP --antenatal  testing wnl  3. Infection of nail bed of finger of right hand --Refer to dermatology, tx with topical soaks, abx, and Valtrex for herpetic whitlow.  Mild improvement only.   4. [redacted] weeks gestation of pregnancy   Term labor symptoms and general obstetric precautions including but not limited to vaginal bleeding, contractions, leaking of fluid and fetal movement were reviewed in detail with the patient. Please refer to After Visit Summary for other counseling recommendations.   Return for Keep scheduled appt for induction of labor on 09/20/21.  Future Appointments  Date Time Provider [place]  09/16/2021 11:15 AM [facility]  09/20/2021  6:30 AM [facility] [place] None    Fatima Blank, CNM

## 2021-09-14 LAB — CERVICOVAGINAL ANCILLARY ONLY
Chlamydia: NEGATIVE
Comment: NEGATIVE
Comment: NORMAL
Neisseria Gonorrhea: NEGATIVE

## 2021-09-15 ENCOUNTER — Other Ambulatory Visit: Payer: Self-pay | Admitting: Advanced Practice Midwife

## 2021-09-16 ENCOUNTER — Ambulatory Visit (INDEPENDENT_AMBULATORY_CARE_PROVIDER_SITE_OTHER): Payer: Medicaid Other | Admitting: General Practice

## 2021-09-16 ENCOUNTER — Ambulatory Visit (INDEPENDENT_AMBULATORY_CARE_PROVIDER_SITE_OTHER): Payer: Medicaid Other

## 2021-09-16 VITALS — BP 131/88 | HR 101

## 2021-09-16 DIAGNOSIS — O280 Abnormal hematological finding on antenatal screening of mother: Secondary | ICD-10-CM

## 2021-09-16 NOTE — Progress Notes (Signed)
Pt informed that the ultrasound is considered a limited OB ultrasound and is not intended to be a complete ultrasound exam.  Patient also informed that the ultrasound is not being completed with the intent of assessing for fetal or placental anomalies or any pelvic abnormalities.  Explained that the purpose of today's ultrasound is to assess for  BPP, presentation, and AFI.  Patient acknowledges the purpose of the exam and the limitations of the study.     Jeanmarc Viernes H RN BSN 09/16/21  

## 2021-09-17 LAB — CULTURE, BETA STREP (GROUP B ONLY): Strep Gp B Culture: NEGATIVE

## 2021-09-20 ENCOUNTER — Encounter (HOSPITAL_COMMUNITY): Payer: Self-pay | Admitting: Family Medicine

## 2021-09-20 ENCOUNTER — Other Ambulatory Visit: Payer: Self-pay

## 2021-09-20 ENCOUNTER — Inpatient Hospital Stay (HOSPITAL_COMMUNITY)
Admission: AD | Admit: 2021-09-20 | Discharge: 2021-09-22 | DRG: 807 | Disposition: A | Discharge status: Home / Self Care | Payer: Medicaid Other | Attending: Family Medicine | Admitting: Family Medicine

## 2021-09-20 ENCOUNTER — Inpatient Hospital Stay (HOSPITAL_COMMUNITY): Payer: Medicaid Other | Admitting: Anesthesiology

## 2021-09-20 ENCOUNTER — Inpatient Hospital Stay (HOSPITAL_COMMUNITY): Payer: Medicaid Other

## 2021-09-20 DIAGNOSIS — E282 Polycystic ovarian syndrome: Secondary | ICD-10-CM

## 2021-09-20 DIAGNOSIS — G43909 Migraine, unspecified, not intractable, without status migrainosus: Secondary | ICD-10-CM

## 2021-09-20 DIAGNOSIS — O99324 Drug use complicating childbirth: Secondary | ICD-10-CM | POA: Diagnosis present

## 2021-09-20 DIAGNOSIS — B029 Zoster without complications: Secondary | ICD-10-CM

## 2021-09-20 DIAGNOSIS — Z3A38 38 weeks gestation of pregnancy: Secondary | ICD-10-CM

## 2021-09-20 DIAGNOSIS — S0092XS Blister (nonthermal) of unspecified part of head, sequela: Secondary | ICD-10-CM

## 2021-09-20 DIAGNOSIS — R772 Abnormality of alphafetoprotein: Secondary | ICD-10-CM | POA: Diagnosis present

## 2021-09-20 DIAGNOSIS — R87610 Atypical squamous cells of undetermined significance on cytologic smear of cervix (ASC-US): Secondary | ICD-10-CM

## 2021-09-20 DIAGNOSIS — Z87891 Personal history of nicotine dependence: Secondary | ICD-10-CM | POA: Diagnosis not present

## 2021-09-20 DIAGNOSIS — F1290 Cannabis use, unspecified, uncomplicated: Secondary | ICD-10-CM

## 2021-09-20 DIAGNOSIS — O99284 Endocrine, nutritional and metabolic diseases complicating childbirth: Secondary | ICD-10-CM | POA: Diagnosis present

## 2021-09-20 DIAGNOSIS — R87810 Cervical high risk human papillomavirus (HPV) DNA test positive: Secondary | ICD-10-CM

## 2021-09-20 DIAGNOSIS — O0990 Supervision of high risk pregnancy, unspecified, unspecified trimester: Secondary | ICD-10-CM

## 2021-09-20 DIAGNOSIS — O26893 Other specified pregnancy related conditions, third trimester: Secondary | ICD-10-CM | POA: Diagnosis present

## 2021-09-20 DIAGNOSIS — O280 Abnormal hematological finding on antenatal screening of mother: Secondary | ICD-10-CM | POA: Diagnosis not present

## 2021-09-20 DIAGNOSIS — Z3480 Encounter for supervision of other normal pregnancy, unspecified trimester: Secondary | ICD-10-CM

## 2021-09-20 DIAGNOSIS — Z91018 Allergy to other foods: Secondary | ICD-10-CM

## 2021-09-20 LAB — CBC
HCT: 31.9 % — ABNORMAL LOW (ref 36.0–46.0)
Hemoglobin: 11.3 g/dL — ABNORMAL LOW (ref 12.0–15.0)
MCH: 32.4 pg (ref 26.0–34.0)
MCHC: 35.4 g/dL (ref 30.0–36.0)
MCV: 91.4 fL (ref 80.0–100.0)
Platelets: 356 10*3/uL (ref 150–400)
RBC: 3.49 MIL/uL — ABNORMAL LOW (ref 3.87–5.11)
RDW: 12.7 % (ref 11.5–15.5)
WBC: 10.6 10*3/uL — ABNORMAL HIGH (ref 4.0–10.5)
nRBC: 0 % (ref 0.0–0.2)

## 2021-09-20 LAB — TYPE AND SCREEN
ABO/RH(D): O POS
Antibody Screen: NEGATIVE

## 2021-09-20 LAB — RPR: RPR Ser Ql: NONREACTIVE

## 2021-09-20 MED ORDER — EPHEDRINE 5 MG/ML INJ: 10.0000 mg | INTRAVENOUS | Status: DC | PRN

## 2021-09-20 MED ORDER — TERBUTALINE SULFATE 1 MG/ML IJ SOLN: 0.2500 mg | Freq: Once | INTRAMUSCULAR | Status: DC | PRN

## 2021-09-20 MED ORDER — ONDANSETRON HCL 4 MG/2ML IJ SOLN
4.0000 mg | Freq: Four times a day (QID) | INTRAMUSCULAR | Status: DC | PRN
  Administered 2021-09-20: 4 mg via INTRAVENOUS
  Filled 2021-09-20: qty 2

## 2021-09-20 MED ORDER — OXYCODONE-ACETAMINOPHEN 5-325 MG PO TABS: 2.0000 | ORAL_TABLET | ORAL | Status: DC | PRN

## 2021-09-20 MED ORDER — ACETAMINOPHEN 325 MG PO TABS: 650.0000 mg | ORAL_TABLET | ORAL | Status: DC | PRN

## 2021-09-20 MED ORDER — OXYCODONE-ACETAMINOPHEN 5-325 MG PO TABS: 1.0000 | ORAL_TABLET | ORAL | Status: DC | PRN

## 2021-09-20 MED ORDER — LACTATED RINGERS IV SOLN: INTRAVENOUS | Status: DC

## 2021-09-20 MED ORDER — FENTANYL CITRATE (PF) 100 MCG/2ML IJ SOLN
INTRAMUSCULAR | Status: AC
  Administered 2021-09-20: 100 ug
  Filled 2021-09-20: qty 2

## 2021-09-20 MED ORDER — OXYTOCIN-SODIUM CHLORIDE 30-0.9 UT/500ML-% IV SOLN
2.5000 [IU]/h | INTRAVENOUS | Status: DC
  Filled 2021-09-20: qty 500

## 2021-09-20 MED ORDER — PHENYLEPHRINE 80 MCG/ML (10ML) SYRINGE FOR IV PUSH (FOR BLOOD PRESSURE SUPPORT): 80.0000 ug | PREFILLED_SYRINGE | INTRAVENOUS | Status: DC | PRN

## 2021-09-20 MED ORDER — OXYTOCIN BOLUS FROM INFUSION
333.0000 mL | Freq: Once | INTRAVENOUS | Status: AC
  Administered 2021-09-20: 333 mL via INTRAVENOUS

## 2021-09-20 MED ORDER — DIPHENHYDRAMINE HCL 50 MG/ML IJ SOLN: 12.5000 mg | INTRAMUSCULAR | Status: DC | PRN

## 2021-09-20 MED ORDER — FENTANYL CITRATE (PF) 100 MCG/2ML IJ SOLN: 100.0000 ug | INTRAMUSCULAR | Status: DC | PRN

## 2021-09-20 MED ORDER — LACTATED RINGERS IV SOLN
500.0000 mL | Freq: Once | INTRAVENOUS | Status: AC
Start: 2021-09-20 — End: 2021-09-20
  Administered 2021-09-20: 500 mL via INTRAVENOUS

## 2021-09-20 MED ORDER — SOD CITRATE-CITRIC ACID 500-334 MG/5ML PO SOLN: 30.0000 mL | ORAL | Status: DC | PRN

## 2021-09-20 MED ORDER — FENTANYL-BUPIVACAINE-NACL 0.5-0.125-0.9 MG/250ML-% EP SOLN
12.0000 mL/h | EPIDURAL | Status: DC | PRN
  Administered 2021-09-20: 12 mL/h via EPIDURAL
  Filled 2021-09-20: qty 250

## 2021-09-20 MED ORDER — LIDOCAINE HCL (PF) 1 % IJ SOLN
INTRAMUSCULAR | Status: DC | PRN
  Administered 2021-09-20 (×2): 4 mL via EPIDURAL

## 2021-09-20 MED ORDER — MISOPROSTOL 50MCG HALF TABLET
50.0000 ug | ORAL_TABLET | ORAL | Status: DC
  Administered 2021-09-20 (×2): 50 ug via ORAL
  Filled 2021-09-20 (×2): qty 1

## 2021-09-20 MED ORDER — LACTATED RINGERS IV SOLN: 500.0000 mL | INTRAVENOUS | Status: DC | PRN

## 2021-09-20 MED ORDER — LIDOCAINE HCL (PF) 1 % IJ SOLN: 30.0000 mL | INTRAMUSCULAR | Status: DC | PRN

## 2021-09-20 MED ORDER — OXYTOCIN-SODIUM CHLORIDE 30-0.9 UT/500ML-% IV SOLN
1.0000 m[IU]/min | INTRAVENOUS | Status: DC
  Administered 2021-09-20: 2 m[IU]/min via INTRAVENOUS

## 2021-09-20 NOTE — Progress Notes (Signed)
Christie Reyes is a 26 y.o. G1P0 at [redacted]w[redacted]d by ultrasound admitted for induction of labor due to elevated AFP.  Subjective: No concerns at this time. Continues to endorse cramping pin   Objective: BP 130/88   Pulse 92   Temp 98.3 F (36.8 C) (Oral)   Resp 16   Ht 5\' 3"  (1.6 m)   Wt 88.8 kg   BMI 34.67 kg/m  No intake/output data recorded. No intake/output data recorded.  FHT:  FHR: 130 bpm, variability: moderate,  accelerations:  Present,  decelerations:  Absent UC:   regular, every 3-5 minutes SVE:   Dilation: 5 Effacement (%): 50 Station: -2 Exam by:: Dr. [email or web address]  Labs: Lab Results  Component Value Date   WBC 10.6 (H) 09/20/2021   HGB 11.3 (L) 09/20/2021   HCT 31.9 (L) 09/20/2021   MCV 91.4 09/20/2021   PLT 356 09/20/2021    Assessment / Plan: Augmentation of labor, progressing well s/p foley bulb and cytotec  Labor: Progressing normally, AROM performed 1759 Preeclampsia:   n/a Fetal Wellbeing:  Category I Pain Control:  IV pain meds I/D:  n/a Anticipated MOD:  NSVD  09/22/2021 09/20/2021, 6:17 PM

## 2021-09-20 NOTE — Anesthesia Preprocedure Evaluation (Signed)
Anesthesia Evaluation  Patient identified by MRN, date of birth, ID band Patient awake    Reviewed: Allergy & Precautions, Patient's Chart, lab work & pertinent test results  History of Anesthesia Complications Negative for: history of anesthetic complications  Airway Mallampati: II  TM Distance: >3 FB Neck ROM: Full    Dental no notable dental hx.    Pulmonary former smoker,    Pulmonary exam normal        Cardiovascular negative cardio ROS Normal cardiovascular exam     Neuro/Psych  Headaches, Anxiety Depression    GI/Hepatic negative GI ROS, Neg liver ROS,   Endo/Other  negative endocrine ROS  Renal/GU negative Renal ROS  negative genitourinary   Musculoskeletal negative musculoskeletal ROS (+)   Abdominal   Peds  Hematology negative hematology ROS (+)   Anesthesia Other Findings Day of surgery medications reviewed with patient.  Reproductive/Obstetrics (+) Pregnancy                             Anesthesia Physical Anesthesia Plan  ASA: 2  Anesthesia Plan: Epidural   Post-op Pain Management:    Induction:   PONV Risk Score and Plan: Treatment may vary due to age or medical condition  Airway Management Planned: Natural Airway  Additional Equipment: Fetal Monitoring  Intra-op Plan:   Post-operative Plan:   Informed Consent: I have reviewed the patients History and Physical, chart, labs and discussed the procedure including the risks, benefits and alternatives for the proposed anesthesia with the patient or authorized representative who has indicated his/her understanding and acceptance.       Plan Discussed with:   Anesthesia Plan Comments:         Anesthesia Quick Evaluation

## 2021-09-20 NOTE — Progress Notes (Signed)
Labor Progress Note Christie Reyes is a 26 y.o. G1P0 at [redacted]w[redacted]d who presented for IOL due to elevated AFP.   S: Doing well. Continues to feel cramping. No concerns.   O:  BP 130/88   Pulse 92   Temp 98.3 F (36.8 C) (Oral)   Resp 16   Ht 5\' 3"  (1.6 m)   Wt 88.8 kg   BMI 34.67 kg/m   EFM: Baseline 135 bpm, moderate variability, + accels, no decels  Toco: Irregular contractions  CVE: Dilation: 5 Effacement (%): 50 Cervical Position: Middle Station: -2 Presentation: Vertex Exam by:: Dr. [email or web address]  A&P: 26 y.o. G1P0 [redacted]w[redacted]d   #Labor: Progressing well s/p foley balloon and Cytotec. AROM performed after verbal consent. Minimal fluid return. Fetal head well applied. Will start Pitocin 2x2 and reassess in 4 hours, sooner as needed.  #Pain: PRN; coping well  #FWB: Cat 1  #GBS negative  [redacted]w[redacted]d, MD 6:16 PM

## 2021-09-20 NOTE — Anesthesia Procedure Notes (Signed)
Epidural Patient location during procedure: OB Start time: 09/20/2021 8:03 PM End time: 09/20/2021 8:06 PM  Staffing Anesthesiologist: Kaylyn Layer, MD Performed: anesthesiologist   Preanesthetic Checklist Completed: patient identified, IV checked, risks and benefits discussed, monitors and equipment checked, pre-op evaluation and timeout performed  Epidural Patient position: sitting Prep: DuraPrep and site prepped and draped Patient monitoring: continuous pulse ox, blood pressure and heart rate Approach: midline Location: L3-L4 Injection technique: LOR air  Needle:  Needle type: Tuohy  Needle gauge: 17 G Needle length: 9 cm Needle insertion depth: 6 cm Catheter type: closed end flexible Catheter size: 19 Gauge Catheter at skin depth: 11 cm Test dose: negative and Other (1% lidocaine)  Assessment Events: blood not aspirated, injection not painful, no injection resistance, no paresthesia and negative IV test  Additional Notes Patient identified. Risks, benefits, and alternatives discussed with patient including but not limited to bleeding, infection, nerve damage, paralysis, failed block, incomplete pain control, headache, blood pressure changes, nausea, vomiting, reactions to medication, itching, and postpartum back pain. Confirmed with bedside nurse the patient's most recent platelet count. Confirmed with patient that they are not currently taking any anticoagulation, have any bleeding history, or any family history of bleeding disorders. Patient expressed understanding and wished to proceed. All questions were answered. Sterile technique was used throughout the entire procedure. Please see nursing notes for vital signs.   Crisp LOR on first pass. Test dose was given through epidural catheter and negative prior to continuing to dose epidural or start infusion. Warning signs of high block given to the patient including shortness of breath, tingling/numbness in hands, complete  motor block, or any concerning symptoms with instructions to call for help. Patient was given instructions on fall risk and not to get out of bed. All questions and concerns addressed with instructions to call with any issues or inadequate analgesia.  Reason for block:procedure for pain

## 2021-09-20 NOTE — H&P (Signed)
OBSTETRIC ADMISSION HISTORY AND PHYSICAL  Christie Reyes is a 26 y.o. female G1P0 with IUP at 60w0dpresenting for IOL for elevate AFP (1/100 OSBR). She reports +FMs. No LOF, VB, blurry vision, headaches, peripheral edema, or RUQ pain. She plans on breastfeeding. She requests none for birth control.  Dating: By 5w [place]--->  Estimated Date of Delivery: 10/04/21  Sono:    _0 , normal anatomy, cephalic presentation, 25027X 20%ile, EFW 5'12   Prenatal History/Complications: -elevated AFP -MJ use -shingles in first trimester  Past Medical History: Past Medical History:  Diagnosis Date   Acute pyelonephritis 01/16/2019   Anxiety    Depression    Migraine    PCOS (polycystic ovarian syndrome)    Renal stones     Past Surgical History: Past Surgical History:  Procedure Laterality Date   adnoidectomy and tonsilectomy     TONSILLECTOMY AND ADENOIDECTOMY      Obstetrical History: OB History     Gravida  1   Para      Term      Preterm      AB      Living         SAB      IAB      Ectopic      Multiple      Live Births              Social History: Social History   Socioeconomic History   Marital status: Single    Spouse name: Not on file   Number of children: Not on file   Years of education: 12   Highest education level: 12th grade  Occupational History   Not on file  Tobacco Use   Smoking status: Former    Types: Cigarettes    Quit date: 04/05/2021    Years since quitting: 0.4   Smokeless tobacco: Never  Vaping Use   Vaping Use: Never used  Substance and Sexual Activity   Alcohol use: No   Drug use: Not Currently    Types: Marijuana    Comment: Last smoked jan 2023   Sexual activity: Yes    Birth control/protection: None    Comment: With a woman  Other Topics Concern   Not on file  Social History Narrative   Not on file   Social Determinants of Health   Financial Resource Strain: Not on file  Food Insecurity: Not on file   Transportation Needs: Not on file  Physical Activity: Not on file  Stress: Not on file  Social Connections: Not on file    Family History: Family History  Problem Relation Age of Onset   Diabetes Mother    Cancer Father    Diabetes Father    Diabetes Paternal Grandmother     Allergies: Allergies  Allergen Reactions   Cefdinir Anaphylaxis   Eggs Or Egg-Derived Products Anaphylaxis   Elemental Sulfur Shortness Of Breath and Swelling   Other Anaphylaxis, Cough and Rash    Dustmites, cats   Sulfa Antibiotics Anaphylaxis   Cat Hair Extract    Influenza Vac Typ  [Influenza Virus Vaccine]     Allergy to eggs   Sulfamethoxazole Swelling    Medications Prior to Admission  Medication Sig Dispense Refill Last Dose   Blood Pressure Monitoring (BLOOD PRESSURE KIT) DEVI 1 Device by Does not apply route once a week. (Patient not taking: Reported on 08/23/2021) 1 each 0    clindamycin (CLEOCIN) 300 MG capsule Take 300 mg  by mouth 3 (three) times daily. Pt reports taking medication 2 times per day (Patient not taking: Reported on 08/12/2021)      docusate sodium (COLACE) 100 MG capsule Take 1 capsule (100 mg total) by mouth 2 (two) times daily. (Patient not taking: Reported on 08/12/2021) 10 capsule 2    metoCLOPramide (REGLAN) 10 MG tablet Take 1 tablet (10 mg total) by mouth 3 (three) times daily with meals as needed for nausea. (Patient not taking: Reported on 08/23/2021) 60 tablet 2    metroNIDAZOLE (METROGEL VAGINAL) 0.75 % vaginal gel Place 1 Applicatorful vaginally at bedtime. Insert one applicator, at bedtime, for 5 nights. (Patient not taking: Reported on 08/02/2021) 70 g 0    Misc. Devices (GOJJI WEIGHT SCALE) MISC 1 Device by Does not apply route once a week. (Patient not taking: Reported on 06/14/2021) 1 each 0    ondansetron (ZOFRAN-ODT) 4 MG disintegrating tablet Take 1 tablet (4 mg total) by mouth every 6 (six) hours as needed for nausea. (Patient not taking: Reported on 08/23/2021) 20  tablet 3    polyethylene glycol powder (GLYCOLAX/MIRALAX) 17 GM/SCOOP powder Take 17 g by mouth daily. (Patient not taking: Reported on 08/12/2021) 255 g 0    Prenatal Vit-Fe Fumarate-FA (PREPLUS) 27-1 MG TABS Take 1 tablet by mouth daily. (Patient not taking: Reported on 08/12/2021) 30 tablet 13    promethazine (PHENERGAN) 12.5 MG tablet Take 1-2 tablets (12.5-25 mg total) by mouth at bedtime as needed for nausea or vomiting. (Patient not taking: Reported on 08/23/2021) 30 tablet 2    scopolamine (TRANSDERM-SCOP) 1 MG/3DAYS Place 1 patch (1.5 mg total) onto the skin every 3 (three) days. (Patient not taking: Reported on 08/23/2021) 10 patch 12      Review of Systems:  All systems reviewed and negative except as stated in HPI  PE: Blood pressure 128/79, pulse 95, temperature 98 F (36.7 C), temperature source Oral, resp. rate 18, height 5' 3" (1.6 m), weight 88.8 kg. General appearance: alert, cooperative, and no distress Lungs: regular rate and effort Heart: regular rate  Abdomen: soft, non-tender Extremities: Homans sign is negative, no sign of DVT Presentation: cephalic EFM: 130 bpm, mod variability, + accels, no decels Toco: rare Dilation: 2 Effacement (%): 60 Station: -2 Exam by:: Davis, RN   Prenatal labs: ABO, Rh: --/--/PENDING (06/19 0650) Antibody: PENDING (06/19 0650) Rubella: 1.31 (12/14 1509) RPR: Non Reactive (04/18 1145)  HBsAg: Negative (12/14 1509)  HIV: Non Reactive (04/18 1145)  GBS: Negative/-- (06/12 1126)  2 hr GTT nml  Prenatal Transfer Tool  Maternal Diabetes: No Genetic Screening: Abnormal:  Results: Elevated AFP Maternal Ultrasounds/Referrals: Normal Fetal Ultrasounds or other Referrals:  Referred to Materal Fetal Medicine  Maternal Substance Abuse:  Yes:  Type: Marijuana Significant Maternal Medications:  None Significant Maternal Lab Results: Group B Strep negative  Results for orders placed or performed during the hospital encounter of 09/20/21  (from the past 24 hour(s))  CBC   Collection Time: 09/20/21  6:50 AM  Result Value Ref Range   WBC 10.6 (H) 4.0 - 10.5 K/uL   RBC 3.49 (L) 3.87 - 5.11 MIL/uL   Hemoglobin 11.3 (L) 12.0 - 15.0 g/dL   HCT 31.9 (L) 36.0 - 46.0 %   MCV 91.4 80.0 - 100.0 fL   MCH 32.4 26.0 - 34.0 pg   MCHC 35.4 30.0 - 36.0 g/dL   RDW 12.7 11.5 - 15.5 %   Platelets 356 150 - 400 K/uL   nRBC 0.0   0.0 - 0.2 %  Type and screen   Collection Time: 09/20/21  6:50 AM  Result Value Ref Range   ABO/RH(D) PENDING    Antibody Screen PENDING    Sample Expiration      09/23/2021,2359 Performed at [facility], [place] [address] [place], [place] [address]     Patient Active Problem List   Diagnosis Date Noted   Abnormal antenatal AFP screen 08/02/2021   Marijuana use 04/06/2021   Blister head, sequela 04/06/2021   Herpes zoster without complication 69/48/5462   Supervision of other normal pregnancy, antepartum 03/02/2021   ASCUS with positive high risk HPV cervical 11/20/2019   PCOS (polycystic ovarian syndrome) 05/24/2016   Migraine 03/12/2012   Multiple food allergies 12/17/2010    Assessment: Christie Reyes is a 26 y.o. G1P0 at 66w0dhere for IOL for elevate AFP  1. Labor: latent 2. FWB: Cat I 3. Pain: analgesia/anesthesia prn 4. GBS: neg   Plan: Admit to LD Cervical ripening Anticipate SVD  MJulianne Handler CNM  09/20/2021, 7:36 AM

## 2021-09-20 NOTE — Progress Notes (Signed)
Labor Progress Note [name] is a 26 y.o. G1P0 at [redacted]w[redacted]d presented for IOL due to elevated AFP.   S: Doing well. No concerns. Feeling cramping pain. Family at bedside.   O:  BP 120/66   Pulse 74   Temp 98 F (36.7 C) (Oral)   Resp 18   Ht 5\' 3"  (1.6 m)   Wt 88.8 kg   BMI 34.67 kg/m   EFM: Baseline 135 bpm, moderate variability, + accels, no decels   CVE: Dilation: 2 Effacement (%): 60 Cervical Position: Middle Station: -2 Presentation: Vertex Exam by:: Dr. [email or web address]  A&P: 26 y.o. G1P0 [redacted]w[redacted]d   #Labor: Progressing well. SVE similar to prior. Discussed foley balloon placement and patient verbally consented. Foley balloon placed and filled with 60 cc. Mom and baby tolerated this well. Additional dose of Cytotec given. Will reassess in 4 hours.  #Pain: PRN, coping well  #FWB: Cat 1 #GBS negative  [redacted]w[redacted]d, MD 12:54 PM

## 2021-09-21 LAB — CBC
HCT: 29.6 % — ABNORMAL LOW (ref 36.0–46.0)
Hemoglobin: 10.3 g/dL — ABNORMAL LOW (ref 12.0–15.0)
MCH: 31.7 pg (ref 26.0–34.0)
MCHC: 34.8 g/dL (ref 30.0–36.0)
MCV: 91.1 fL (ref 80.0–100.0)
Platelets: 318 10*3/uL (ref 150–400)
RBC: 3.25 MIL/uL — ABNORMAL LOW (ref 3.87–5.11)
RDW: 12.6 % (ref 11.5–15.5)
WBC: 19.1 10*3/uL — ABNORMAL HIGH (ref 4.0–10.5)
nRBC: 0 % (ref 0.0–0.2)

## 2021-09-21 MED ORDER — TETANUS-DIPHTH-ACELL PERTUSSIS 5-2.5-18.5 LF-MCG/0.5 IM SUSY: 0.5000 mL | PREFILLED_SYRINGE | Freq: Once | INTRAMUSCULAR | Status: DC

## 2021-09-21 MED ORDER — COCONUT OIL OIL
1.0000 | TOPICAL_OIL | Status: DC | PRN
  Administered 2021-09-21: 1 via TOPICAL

## 2021-09-21 MED ORDER — DIBUCAINE (PERIANAL) 1 % EX OINT: 1.0000 | TOPICAL_OINTMENT | CUTANEOUS | Status: DC | PRN

## 2021-09-21 MED ORDER — PRENATAL MULTIVITAMIN CH
1.0000 | ORAL_TABLET | Freq: Every day | ORAL | Status: DC
  Administered 2021-09-21: 1 via ORAL
  Filled 2021-09-21: qty 1

## 2021-09-21 MED ORDER — ZOLPIDEM TARTRATE 5 MG PO TABS: 5.0000 mg | ORAL_TABLET | Freq: Every evening | ORAL | Status: DC | PRN

## 2021-09-21 MED ORDER — ONDANSETRON HCL 4 MG/2ML IJ SOLN: 4.0000 mg | INTRAMUSCULAR | Status: DC | PRN

## 2021-09-21 MED ORDER — BENZOCAINE-MENTHOL 20-0.5 % EX AERO
1.0000 | INHALATION_SPRAY | CUTANEOUS | Status: DC | PRN
  Administered 2021-09-21: 1 via TOPICAL
  Filled 2021-09-21: qty 56

## 2021-09-21 MED ORDER — IBUPROFEN 600 MG PO TABS
600.0000 mg | ORAL_TABLET | Freq: Four times a day (QID) | ORAL | Status: DC
  Administered 2021-09-21 – 2021-09-22 (×5): 600 mg via ORAL
  Filled 2021-09-21 (×5): qty 1

## 2021-09-21 MED ORDER — SIMETHICONE 80 MG PO CHEW: 80.0000 mg | CHEWABLE_TABLET | ORAL | Status: DC | PRN

## 2021-09-21 MED ORDER — WITCH HAZEL-GLYCERIN EX PADS
1.0000 | MEDICATED_PAD | CUTANEOUS | Status: DC | PRN
  Administered 2021-09-21: 1 via TOPICAL

## 2021-09-21 MED ORDER — ACETAMINOPHEN 325 MG PO TABS: 650.0000 mg | ORAL_TABLET | ORAL | Status: DC | PRN

## 2021-09-21 MED ORDER — MEASLES, MUMPS & RUBELLA VAC IJ SOLR: 0.5000 mL | Freq: Once | INTRAMUSCULAR | Status: DC

## 2021-09-21 MED ORDER — DIPHENHYDRAMINE HCL 25 MG PO CAPS: 25.0000 mg | ORAL_CAPSULE | Freq: Four times a day (QID) | ORAL | Status: DC | PRN

## 2021-09-21 MED ORDER — SENNOSIDES-DOCUSATE SODIUM 8.6-50 MG PO TABS: 2.0000 | ORAL_TABLET | Freq: Every day | ORAL | Status: DC

## 2021-09-21 MED ORDER — ONDANSETRON HCL 4 MG PO TABS: 4.0000 mg | ORAL_TABLET | ORAL | Status: DC | PRN

## 2021-09-21 NOTE — Progress Notes (Signed)
Post Partum Day 1  Subjective: no complaints, up ad lib, voiding, and tolerating PO  Objective: Blood pressure 122/61, pulse 86, temperature 98 F (36.7 C), temperature source Oral, resp. rate 16, height 5\' 3"  (1.6 m), weight 88.8 kg, SpO2 100 %.  Physical Exam:  General: alert and no distress Lochia: appropriate Uterine Fundus: firm DVT Evaluation: No evidence of DVT seen on physical exam. No cords or calf tenderness.  Recent Labs    09/20/21 0650 09/21/21 0518  HGB 11.3* 10.3*  HCT 31.9* 29.6*    Assessment/Plan: Plan for discharge tomorrow  #Routine PP care -Contraception: undecided. Discussed LARCs. All questions answered    LOS: 1 day   09/23/21 09/21/2021, 7:53 AM   GME ATTESTATION:  I saw and evaluated the patient. I agree with the findings and the plan of care as documented in the resident's note.  Doing well. Plan for discharge home tomorrow.   09/23/2021, DO OB Fellow, Faculty [facility], [facility] for [facility] 09/21/2021 8:18 AM

## 2021-09-21 NOTE — Anesthesia Postprocedure Evaluation (Signed)
Anesthesia Post Note  Patient: Christie Reyes  Procedure(s) Performed: AN AD HOC LABOR EPIDURAL     Patient location during evaluation: Mother Baby Anesthesia Type: Epidural Level of consciousness: awake and alert Pain management: pain level controlled Vital Signs Assessment: post-procedure vital signs reviewed and stable Respiratory status: spontaneous breathing, nonlabored ventilation and respiratory function stable Cardiovascular status: stable Postop Assessment: no headache, no backache and epidural receding Anesthetic complications: no   No notable events documented.  Last Vitals:  Vitals:   09/21/21 0157 09/21/21 0443  BP: 118/69 122/61  Pulse: 91 86  Resp: 18 16  Temp: 36.9 C 36.7 C  SpO2: 100% 100%    Last Pain:  Vitals:   09/21/21 0443  TempSrc: Oral  PainSc: (P) 0-No pain   Pain Goal:                   Joniqua Sidle

## 2021-09-21 NOTE — Lactation Note (Signed)
This note was copied from a baby's chart. Lactation Consultation Note FOB holding baby STS. Mom got baby STS, LC assisted baby to the breast. Baby latched easily right away. Answered the few questions mom had.  Mom talking to baby as she is feeding. Left mom for bonding time.  Patient Name: Christie Reyes Today's Date: 09/21/2021 Reason for consult: L&D Initial assessment;Primapara;Early term 37-38.6wks Age:24 hours  Maternal Data    Feeding    LATCH Score Latch: Grasps breast easily, tongue down, lips flanged, rhythmical sucking.  Audible Swallowing: A few with stimulation  Type of Nipple: Everted at rest and after stimulation  Comfort (Breast/Nipple): Soft / non-tender  Hold (Positioning): Assistance needed to correctly position infant at breast and maintain latch.  LATCH Score: 8   Lactation Tools Discussed/Used    Interventions Interventions: Breast feeding basics reviewed;Adjust position;Assisted with latch;Support pillows;Skin to skin;Breast compression  Discharge    Consult Status Consult Status: Follow-up from L&D Date: 09/21/21 Follow-up type: In-patient    Charyl Dancer 09/21/2021, 12:48 AM

## 2021-09-21 NOTE — Discharge Summary (Shared)
Postpartum Discharge Summary  Date of Service updated***     Patient Name: GENESE QUEBEDEAUX DOB: 1995/04/06 MRN: [id number]  Date of admission: 09/20/2021 Delivery date:09/20/2021  Delivering provider: Donnamae Jude  Date of discharge: 09/21/2021  Admitting diagnosis: Abnormal antenatal AFP screen [O28.0] Intrauterine pregnancy: [redacted]w[redacted]d    Secondary diagnosis:  Principal Problem:   Abnormal antenatal AFP screen  Additional problems: None    Discharge diagnosis: Term Pregnancy Delivered                                              Post partum procedures: none Augmentation: AROM, Pitocin, and Cytotec Complications: None  Hospital course: Induction of Labor With Vaginal Delivery   26y.o. yo G1P0 at 335w1das admitted to the hospital 09/20/2021 for induction of labor.  Indication for induction:  elevated MSAFP .  Patient had an uncomplicated labor course as follows: Membrane Rupture Time/Date: 5:59 PM ,09/20/2021   Delivery Method:Vaginal, Spontaneous  Episiotomy: None  Lacerations:  1st degree  Details of delivery can be found in separate delivery note.  Patient had a routine postpartum course. Patient is discharged home 09/21/21.  Newborn Data: Birth date:09/20/2021  Birth time:11:48 PM  Gender:Female  Living status:Living  Apgars:9 ,9  Weight:   Magnesium Sulfate received: No BMZ received: No Rhophylac:N/A MMR:N/A T-DaP:{Tdap:23962} Flu: N/A Transfusion:No  Physical exam  Vitals:   09/20/21 2230 09/20/21 2300 09/20/21 2310 09/20/21 2330  BP: 123/79 (!) 143/96  130/84  Pulse: 83 (!) 104  93  Resp:      Temp:   98.8 F (37.1 C)   TempSrc:   Oral   SpO2:      Weight:      Height:       General: alert, cooperative, and no distress Lochia: appropriate Uterine Fundus: firm DVT Evaluation: No evidence of DVT seen on physical exam. Labs: Lab Results  Component Value Date   WBC 10.6 (H) 09/20/2021   HGB 11.3 (L) 09/20/2021   HCT 31.9 (L) 09/20/2021   MCV  91.4 09/20/2021   PLT 356 09/20/2021      Latest Ref Rng & Units 03/17/2021    3:17 PM  CMP  Glucose 70 - 99 mg/dL 77   BUN 6 - 20 mg/dL 7   Creatinine 0.57 - 1.00 mg/dL 0.63   Sodium 134 - 144 mmol/L 137   Potassium 3.5 - 5.2 mmol/L 4.7   Chloride 96 - 106 mmol/L 101   CO2 20 - 29 mmol/L 21   Calcium 8.7 - 10.2 mg/dL 9.6   Total Protein 6.0 - 8.5 g/dL 6.9   Total Bilirubin 0.0 - 1.2 mg/dL 0.2   Alkaline Phos 44 - 121 IU/L 69   AST 0 - 40 IU/L 14   ALT 0 - 32 IU/L 9    Edinburgh Score:     No data to display           After visit meds:  Allergies as of 09/21/2021       Reactions   Cefdinir Anaphylaxis   Eggs Or Egg-derived Products Anaphylaxis   Elemental Sulfur Shortness Of Breath, Swelling   Other Anaphylaxis, Cough, Rash   Dustmites, cats   Sulfa Antibiotics Anaphylaxis   Cat Hair Extract    Influenza Vac Typ  [influenza Virus Vaccine]    Allergy to eggs  Sulfamethoxazole Swelling     Med Rec must be completed prior to using this [facility]***        Discharge home in stable condition Infant Feeding: Breast Infant Disposition:home with mother Discharge instruction: per After Visit Summary and Postpartum booklet. Activity: Advance as tolerated. Pelvic rest for 6 weeks.  Diet: routine diet Future Appointments:No future appointments. Follow up Visit:   Please schedule this patient for a Virtual postpartum visit in 4 weeks with the following provider: Any provider. Additional Postpartum F/U: none   High risk pregnancy complicated by:  elevated MSAFP Delivery mode:  Vaginal, Spontaneous  Anticipated Birth Control:  POPs Sent by Kennon Rounds 09/21/2021  09/21/2021 Patriciaann Clan, DO

## 2021-09-21 NOTE — Lactation Note (Signed)
This note was copied from a baby's chart. Lactation Consultation Note Assisted baby to latch. Baby BF great. Mom has good everted nipples and freely flowing colostrum. Swallows heard. Praised mom. Newborn feeding habits, behavior, STS, I&O, body alignment, positioning, supply and demand reviewed. Mom encouraged to feed baby 8-12 times/24 hours and with feeding cues.   Encouraged mom to call for assistance or questions.  Patient Name: Christie Reyes Today's Date: 09/21/2021 Reason for consult: Initial assessment;Maternal endocrine disorder;Primapara;Early term 41-38.6wks Age:20 hours  Maternal Data Has patient been taught Hand Expression?: Yes Does the patient have breastfeeding experience prior to this delivery?: No  Feeding    LATCH Score Latch: Grasps breast easily, tongue down, lips flanged, rhythmical sucking.  Audible Swallowing: Spontaneous and intermittent  Type of Nipple: Everted at rest and after stimulation  Comfort (Breast/Nipple): Soft / non-tender  Hold (Positioning): Assistance needed to correctly position infant at breast and maintain latch.  LATCH Score: 9   Lactation Tools Discussed/Used    Interventions Interventions: Breast feeding basics reviewed;Assisted with latch;Skin to skin;Breast massage;Hand express;Breast compression;Adjust position;Support pillows;Position options;LC Services brochure  Discharge    Consult Status Consult Status: Follow-up Date: 09/21/21 Follow-up type: In-patient    Aakash Hollomon, Diamond Nickel 09/21/2021, 3:17 AM

## 2021-09-22 MED ORDER — SENNOSIDES-DOCUSATE SODIUM 8.6-50 MG PO TABS: 2.0000 | ORAL_TABLET | Freq: Every day | ORAL | 0 refills | Status: AC

## 2021-09-22 MED ORDER — IBUPROFEN 600 MG PO TABS: 600.0000 mg | ORAL_TABLET | Freq: Four times a day (QID) | ORAL | 0 refills | Status: DC

## 2021-09-22 MED ORDER — IBUPROFEN 600 MG PO TABS: 600.0000 mg | ORAL_TABLET | Freq: Three times a day (TID) | ORAL | 0 refills | Status: DC | PRN

## 2021-09-22 MED ORDER — ACETAMINOPHEN 325 MG PO TABS: 650.0000 mg | ORAL_TABLET | ORAL | 0 refills | Status: DC | PRN

## 2021-09-22 MED ORDER — SIMETHICONE 80 MG PO CHEW: 80.0000 mg | CHEWABLE_TABLET | ORAL | 0 refills | Status: DC | PRN

## 2021-09-22 NOTE — Lactation Note (Signed)
This note was copied from a baby's chart. Lactation Consultation Note  Patient Name: Christie Reyes Today's Date: 09/22/2021 - mom , dad , grandmother and baby ready for D/C.  Reason for consult: Follow-up assessment;Infant weight loss;Other (Comment);Nipple pain/trauma;Primapara;1st time breastfeeding;Early term 37-38.6wks;Exclusive pumping and bottle feeding (Per  mom pumping only for now until the sore nipples improve. LC reviewed supply and demand/ importance of pumping both breast 8-10 times in 24 hours to protect the establishing milk supply. Use a dab of coconut oil on the nipple and areolas.) Age:24 hours LC plan - Shells between feedings except when sleeping.  Keep consistent pumping of both breast until nipple soreness improves.  Once improves - baby may need a appetizer prior to latching due to having feedings from a bottle, then latch with firm support.  Once both nipples have healed  baby can breast feed both breast.  LC reviewed BF D/C teaching and mom has her BF resource sheet.   Maternal Data    Feeding Mother's Current Feeding Choice: Breast Milk and Formula Nipple Type: Extra Slow Flow  LATCH Score                    Lactation Tools Discussed/Used    Interventions Interventions: Breast feeding basics reviewed;Shells;Coconut oil;DEBP;Education;LC Services brochure  Discharge Discharge Education: Engorgement and breast care;Warning signs for feeding baby Pump: DEBP;Personal  Consult Status Consult Status: Complete Date: 09/22/21    Christie Reyes 09/22/2021, 12:02 PM

## 2021-09-22 NOTE — Lactation Note (Signed)
This note was copied from a baby's chart. Lactation Consultation Note Mom called to see Lactation d/t her nipples are hurting her badly. Mom stated the baby has been cluster feeding and doesn't want to stop and her nipples can't take any more. Mom requesting formula. Discussed w/mom since she wants to supplement pumping is advised. Mom stated no she needed her nipples to get a break and they are hurting to bad she doesn't want to pump. LC gave mom supplementation sheet and reviewed formula feeding and supplementation. Mom has coconut oil at bedside and has been applying.  Discussed pace feeding. FOB gave the formula. Baby tolerated well. Encouraged mom to call for assistance when wanting to latch.   Patient Name: Girl Lacresia Diemer Today's Date: 09/22/2021 Reason for consult: Primapara;Mother's request;Nipple pain/trauma;Early term 37-38.6wks Age:53 hours  Maternal Data Does the patient have breastfeeding experience prior to this delivery?: No  Feeding Mother's Current Feeding Choice: Breast Milk and Formula Nipple Type: Nfant Slow Flow (purple)  LATCH Score       Type of Nipple: Everted at rest and after stimulation  Comfort (Breast/Nipple): Filling, red/small blisters or bruises, mild/mod discomfort         Lactation Tools Discussed/Used Tools: Coconut oil  Interventions Interventions: Breast feeding basics reviewed;Coconut oil  Discharge    Consult Status Consult Status: Follow-up Date: 09/23/21 Follow-up type: In-patient    Kale Rondeau, Diamond Nickel 09/22/2021, 2:09 AM

## 2021-09-22 NOTE — Clinical Social Work Maternal (Signed)
CLINICAL SOCIAL WORK MATERNAL/CHILD NOTE  Patient Details  Name: Christie Reyes MRN: [id number] Date of Birth: 08/19/1995  Date:  09/22/2021  Clinical Social Worker Initiating Note:  Lucillia Corson, LCSW Date/Time: Initiated:  09/22/21/1059     Child's Name:  Christie Reyes   Biological Parents:  Father, Mother (MOB: Christie Reyes 01/19/1998/ FOB: Christie Reyes 05-05-1998)   Need for Interpreter:  None   Reason for Referral:  Current Substance Use/Substance Use During Pregnancy     Address:  [address] [place] [address]    Phone number:  [phone] (home)     Additional phone number:  Household Members/Support Persons (HM/SP):   Household Member/Support Person 1   HM/SP Name Relationship DOB or Age  HM/SP -1 Christie Reyes Significant Other 05-05-1998  HM/SP -2        HM/SP -3        HM/SP -4        HM/SP -5        HM/SP -6        HM/SP -7        HM/SP -8          Natural Supports (not living in the home):  Immediate Family   Professional Supports: None   Employment: Unemployed (FOB employed at [employer])   Type of Work:     Education:  High school graduate   Homebound arranged:    Financial Resources:  Medicaid   Other Resources:  WIC, Food Stamps     Cultural/Religious Considerations Which May Impact Care:    Strengths:  Ability to meet basic needs  , Home prepared for child  , Pediatrician chosen   Psychotropic Medications:         Pediatrician:    [place] area  Pediatrician List:   [place] Other ([facility])  [place]    [place]    [place]    [place]    [place]      Pediatrician Fax Number:    Risk Factors/Current Problems:  Substance Use     Cognitive State:  Able to Concentrate  , Insightful  , Alert     Mood/Affect:  Comfortable  , Calm  , Happy     CSW Assessment:CSW received consult for THC use. CSW met with MOB to offer support and  complete assessment.    CSW met with MOB at bedside and introduced CSW role. CSW observed MOB sitting, FOB at bedside and supports present holding the infant. CSW offered MOB privacy. MOB supports left the room and FOB stayed at MOB's. MOB gave CSW permission to share all information with FOB present. MOB presented calm and receptive to the visit. MOB confirmed that the demographic information on hospital file is correct. MOB reported that she, FOB, and her mom live together. MOB identified her mom and significant other as supports. MOB reported she is currently unemployed, and FOB is employed at [employer]. MOB reported she receives WIC/FS benefits and will notify both agencies about the birth.   CSW inquired about MOB substance use during the pregnancy. MOB reported that she smoked marijuana during the pregnancy. MOB reported that she could not eat because of nausea and her OB said it was fine for her to use marijuana. MOB reported that her OB physicians were aware of the marijuana use. MOB reported the last time she used marijuana was about four weeks ago. CSW informed MOB about the hospital   drug screen policy. MOB made aware that the infant's UDS was negative so CSW will continue to follow the infant CDS and make a report to CPS, if warranted. MOB reported understanding.  CSW inquired if MOB has mental health history. MOB reported she was diagnosed with anxiety and depression as a child. MOB reported she has not had any concerns with anxiety and depression since then. MOB reported feeling "really happy" about the baby. CSW discussed PPD symptoms. CSW provided education regarding the baby blues period vs. perinatal mood disorders, discussed treatment and gave resources for mental health follow up if concerns arise.  CSW recommended MOB complete a self-evaluation during the postpartum time period using the New Mom Checklist from Postpartum Progress and encouraged MOB to contact a medical professional  if symptoms are noted at any time. CSW assessed MOB for safety. MOB denied thoughts of harm to self and others.   MOB reported she has essential items for the infant including a bassinet where the infant will sleep. MOB has chosen [facility] for the infant's follow up care. CSW provided review of Sudden Infant Death Syndrome (SIDS) precautions. CSW provided MOB with a comprehensive list of [place] resources for reference. MOB declined the need for any other referrals or resources.  CSW Will Continue to Monitor Umbilical Cord Tissue Drug Screen Results and Make Report if Warranted.  CSW identifies no further need for intervention and no barriers to discharge at this time.   CSW Plan/Description:  Sudden Infant Death Syndrome (SIDS) Education, CSW Will Continue to Monitor Umbilical Cord Tissue Drug Screen Results and Make Report if Warranted, Hospital Drug Screen Policy Information, Perinatal Mood and Anxiety Disorder (PMADs) Education, No Further Intervention Required/No Barriers to Discharge    Aser Nylund A Leenah Seidner, LCSW 09/22/2021, 11:09 AM 

## 2021-09-24 ENCOUNTER — Encounter: Payer: Self-pay | Admitting: Obstetrics and Gynecology

## 2021-09-29 ENCOUNTER — Telehealth (HOSPITAL_COMMUNITY): Payer: Self-pay | Admitting: *Deleted

## 2021-09-29 NOTE — Telephone Encounter (Signed)
Mom reports feeling good. No concerns about herself at this time. EPDS not completed as patient states she's so happy. [facility] score=0) Mom reports baby is doing well. Feeding, peeing, and pooping without difficulty. Safe sleep reviewed. Mom reports no concerns about baby at present.  Duffy Rhody, RN 09-29-2021 at 2:15pm

## 2021-10-19 ENCOUNTER — Encounter: Payer: Self-pay | Admitting: Advanced Practice Midwife

## 2021-10-19 ENCOUNTER — Telehealth: Payer: Medicaid Other | Admitting: Advanced Practice Midwife

## 2021-10-19 NOTE — Progress Notes (Signed)
Pt not available when called for today's visit. VM left for pt to reschedule.

## 2021-11-23 ENCOUNTER — Other Ambulatory Visit (HOSPITAL_COMMUNITY)
Admission: RE | Admit: 2021-11-23 | Discharge: 2021-11-23 | Disposition: A | Discharge status: Home / Self Care | Payer: Medicaid Other | Source: Ambulatory Visit | Attending: Certified Nurse Midwife | Admitting: Certified Nurse Midwife

## 2021-11-23 ENCOUNTER — Ambulatory Visit: Payer: Medicaid Other | Admitting: Certified Nurse Midwife

## 2021-11-23 ENCOUNTER — Encounter: Payer: Self-pay | Admitting: Certified Nurse Midwife

## 2021-11-23 VITALS — BP 125/81 | HR 84 | Ht 63.0 in | Wt 198.0 lb

## 2021-11-23 DIAGNOSIS — N941 Unspecified dyspareunia: Secondary | ICD-10-CM

## 2021-11-23 DIAGNOSIS — N898 Other specified noninflammatory disorders of vagina: Secondary | ICD-10-CM

## 2021-11-23 DIAGNOSIS — R102 Pelvic and perineal pain: Secondary | ICD-10-CM | POA: Diagnosis not present

## 2021-11-23 DIAGNOSIS — B3731 Acute candidiasis of vulva and vagina: Secondary | ICD-10-CM

## 2021-11-23 MED ORDER — ESTROGENS CONJUGATED 0.625 MG/GM VA CREA: 0.5000 g | TOPICAL_CREAM | VAGINAL | 0 refills | Status: DC

## 2021-11-23 NOTE — Progress Notes (Signed)
Post Partum Visit Note  Christie Reyes is a 26 y.o. G36P1001 female who presents for a postpartum visit. She is 9 weeks postpartum following a normal spontaneous vaginal delivery.  I have fully reviewed the prenatal and intrapartum course. The delivery was at 38.0 gestational weeks.  Anesthesia: epidural. Postpartum course has been unremarkable. Baby is doing well. Baby is feeding by bottle - Similac Sensitive RS. Bleeding no bleeding. Reports increased vaginal discharge, no itching or malodor. Bowel function is abnormal: 3 times a week . Bladder function is normal. Patient is sexually active. Reports 2 episodes of IC that were painful, had to stop during the second. Contraception method is none. Postpartum depression screening: negative.   The pregnancy intention screening data noted above was reviewed. Potential methods of contraception were discussed. The patient elected to proceed with No data recorded.   Edinburgh Postnatal Depression Scale - 11/23/21 0923       Edinburgh Postnatal Depression Scale:  In the Past 7 Days   I have been able to laugh and see the funny side of things. 0    I have looked forward with enjoyment to things. 0    I have blamed myself unnecessarily when things went wrong. 0    I have been anxious or worried for no good reason. 0    I have felt scared or panicky for no good reason. 0    Things have been getting on top of me. 1    I have been so unhappy that I have had difficulty sleeping. 0    I have felt sad or miserable. 0    I have been so unhappy that I have been crying. 0    The thought of harming myself has occurred to me. 0    Edinburgh Postnatal Depression Scale Total 1             Health Maintenance Due  Topic Date Due   COVID-19 Vaccine (1) Never done   TETANUS/TDAP  03/26/2019   INFLUENZA VACCINE  11/02/2021    The following portions of the patient's history were reviewed and updated as appropriate: allergies, current medications, past  family history, past medical history, past social history, past surgical history, and problem list.  Review of Systems Pertinent items are noted in HPI.  Objective:  BP 125/81   Pulse 84   Ht 5\' 3"  (1.6 m)   Wt 198 lb (89.8 kg)   Breastfeeding No   BMI 35.07 kg/m    General:  alert, cooperative, and no distress   Breasts:  not indicated  Lungs: NWOB  Heart:  Regular rate  Abdomen: N/a  Wound N/a  GU exam:  normal, laceration well healed, TTP       Assessment:   1. Vaginal discharge   2. Postpartum exam   3. Perineal pain   4. Dyspareunia in female    Plan:   Essential components of care per ACOG recommendations:  1.  Mood and well being: Patient with negative depression screening today. Reviewed local resources for support.  - Patient tobacco use? No.   - hx of drug use? No.    2. Infant care and feeding:  -Patient currently breastmilk feeding? No.  -Social determinants of health (SDOH) reviewed in EPIC. No concerns - Patient does not want a pregnancy in the next year.    - Reviewed reproductive life planning. Reviewed contraceptive methods based on pt preferences and effectiveness.  Patient desired Female Condom today.   -  Discussed birth spacing of 18 months  4. Sleep and fatigue -Encouraged family/partner/community support of 4 hrs of uninterrupted sleep to help with mood and fatigue  5. Physical Recovery  - Discussed patients delivery and complications. She describes her labor as good. - Patient had a Vaginal, no problems at delivery. Patient had a 1st degree laceration. Perineal healing reviewed. Patient expressed understanding - Patient has urinary incontinence? No. - Patient is safe to resume physical and sexual activity  6.  Health Maintenance - HM due items addressed Yes - Last pap smear  Diagnosis  Date Value Ref Range Status  03/17/2021 (A)  Final   - Atypical squamous cells of undetermined significance (ASC-US)   Pap smear not done at today's  visit.  -Breast Cancer screening indicated? No.   7. Chronic Disease/Pregnancy Condition follow up: None - PCP follow up  8. Vaginal discharge - Aptima swab  9. Dyspareunia - perineal massage with lubrication - warm compresses - Rx vaginal estrogen cream 2x weekly - alternative forms of intimacy discussed - reassured pain will improve with time  10. Constipation - dietary fiber and water - Colace, Miralax - recs sent via MyChart   Donette Larry, CNM [facility] [employer], [facility]

## 2021-11-24 LAB — CERVICOVAGINAL ANCILLARY ONLY
Bacterial Vaginitis (gardnerella): NEGATIVE
Candida Glabrata: NEGATIVE
Candida Vaginitis: POSITIVE — AB
Comment: NEGATIVE
Comment: NEGATIVE
Comment: NEGATIVE

## 2021-11-24 MED ORDER — FLUCONAZOLE 150 MG PO TABS: 150.0000 mg | ORAL_TABLET | Freq: Once | ORAL | 0 refills | Status: AC

## 2021-11-24 NOTE — Addendum Note (Signed)
Addended by: Donette Larry E on: 11/24/2021 12:39 PM   Modules accepted: Orders

## 2022-04-01 ENCOUNTER — Telehealth: Payer: Self-pay | Admitting: *Deleted

## 2022-04-01 NOTE — Telephone Encounter (Signed)
Patient is having Postpartum depression. Delivered on 09/20/2021. Patient advised by Mariel Aloe, RN to schedule with a PCP since she is over 6 months Postpartum. Patient was given contact information to the following PCP offices:  [facility] [phone]  [facility] [phone]  [facility] [phone]

## 2023-02-02 ENCOUNTER — Ambulatory Visit: Payer: Medicaid Other | Admitting: Obstetrics and Gynecology

## 2023-02-02 ENCOUNTER — Encounter: Payer: Self-pay | Admitting: Obstetrics and Gynecology

## 2023-02-02 VITALS — BP 114/73 | HR 83 | Resp 16 | Ht 63.0 in | Wt 186.0 lb

## 2023-02-02 DIAGNOSIS — N912 Amenorrhea, unspecified: Secondary | ICD-10-CM | POA: Diagnosis not present

## 2023-02-02 DIAGNOSIS — E282 Polycystic ovarian syndrome: Secondary | ICD-10-CM

## 2023-02-02 DIAGNOSIS — Z3202 Encounter for pregnancy test, result negative: Secondary | ICD-10-CM | POA: Diagnosis not present

## 2023-02-02 DIAGNOSIS — F419 Anxiety disorder, unspecified: Secondary | ICD-10-CM | POA: Diagnosis not present

## 2023-02-02 DIAGNOSIS — R61 Generalized hyperhidrosis: Secondary | ICD-10-CM | POA: Diagnosis not present

## 2023-02-02 LAB — POCT URINE PREGNANCY: Preg Test, Ur: NEGATIVE

## 2023-02-02 MED ORDER — MEDROXYPROGESTERONE ACETATE 10 MG PO TABS: 10.0000 mg | ORAL_TABLET | Freq: Every day | ORAL | 2 refills | Status: DC

## 2023-02-02 NOTE — Patient Instructions (Signed)
Helpful things for constipation  - Kiwi fruit with skin on (only 1 per day!) - Increase fiber in diet and drink lots of water - Over the counter laxatives - Miralax or senna daily. Senna can cause more cramping, so Miralax might be most helpful. Drink with lots of water or you can get dehydrated

## 2023-02-02 NOTE — Progress Notes (Signed)
   RETURN GYNECOLOGY VISIT  Subjective:  Christie Reyes is a 27 y.o. G1P1001 with LMP 11/23/22 presenting for discussion of PCOS  Several symptoms of concern today including: - skipped period in September & october - Worsening anxiety/feeling on edge - has tried several SSRIs/SNRIs with her PCP and they don't seem to be helping  - Decreased sex drive/desire for sex - present since before her child was born in 2022 but is getting worse/not improving - Sweating/hot flashes - more frequent since postpartum, happening 3-4x/wk - Crampy lower abdominal pain, constipation - tolerating PO - weight fluctuation  Reports that providers & people in her life have been attributing her symptoms to PCOS and postpartum. She would like another opinion  Objective:   Vitals:   02/02/23 1456  BP: 114/73  Pulse: 83  Resp: 16  Weight: 186 lb (84.4 kg)  Height: 5\' 3"  (1.6 m)   General:  Alert, oriented and cooperative. Patient is in no acute distress.  Skin: Skin is warm and dry. No rash noted.   Cardiovascular: Normal heart rate noted  Respiratory: Normal respiratory effort, no problems with respiration noted  Abdomen: Soft, non-tender, non-distended    Assessment and Plan:  Christie Reyes is a 27 y.o. with multiple symptoms of concern  PCOS (polycystic ovarian syndrome) Amenorrhea UPT negative Discussed skipped periods can be attributed to PCOS but the remainder of her symptoms are unlikely to be PCOS Reviewed importance of endometrial protection to reduce risk of endometrial cancer. Discussed options for endometrial protection - pt would prefer to do cyclic progestins as needed. Instructed to take provera x 10d if she goes more than 3 months without a period -     medroxyPROGESTERone (PROVERA) 10 MG tablet; Take 1 tablet (10 mg total) by mouth daily. Use for ten days if you go 12+ weeks without a period -     POCT urine pregnancy  Hyperhidrosis Anxiety Discussed ddx for her symptoms could  include medication side effect, hyperthyroidism, generalized anxiety, etc. Will complete work up below Pt would like referral to psychiatry for other management options for her anxiety given persistent symptoms despite trying many first line medications -     CBC -     TSH Rfx on Abnormal to Free T4 -     Comp Met (CMET) -     Ambulatory referral to Psychiatry   Return for pap smear.  Lennart Pall, MD

## 2023-02-08 ENCOUNTER — Ambulatory Visit: Payer: Medicaid Other | Admitting: Obstetrics and Gynecology

## 2023-09-13 ENCOUNTER — Inpatient Hospital Stay (HOSPITAL_COMMUNITY)
Admission: AD | Admit: 2023-09-13 | Discharge: 2023-09-13 | Disposition: A | Discharge status: Home / Self Care | Attending: Obstetrics and Gynecology | Admitting: Obstetrics and Gynecology

## 2023-09-13 ENCOUNTER — Encounter (HOSPITAL_COMMUNITY): Payer: Self-pay | Admitting: *Deleted

## 2023-09-13 ENCOUNTER — Inpatient Hospital Stay (HOSPITAL_COMMUNITY)

## 2023-09-13 DIAGNOSIS — O219 Vomiting of pregnancy, unspecified: Secondary | ICD-10-CM | POA: Diagnosis not present

## 2023-09-13 DIAGNOSIS — Z3A01 Less than 8 weeks gestation of pregnancy: Secondary | ICD-10-CM | POA: Insufficient documentation

## 2023-09-13 DIAGNOSIS — Z3A12 12 weeks gestation of pregnancy: Secondary | ICD-10-CM

## 2023-09-13 DIAGNOSIS — O209 Hemorrhage in early pregnancy, unspecified: Secondary | ICD-10-CM | POA: Insufficient documentation

## 2023-09-13 DIAGNOSIS — O99611 Diseases of the digestive system complicating pregnancy, first trimester: Secondary | ICD-10-CM | POA: Diagnosis not present

## 2023-09-13 DIAGNOSIS — K219 Gastro-esophageal reflux disease without esophagitis: Secondary | ICD-10-CM

## 2023-09-13 LAB — WET PREP, GENITAL
Clue Cells Wet Prep HPF POC: NONE SEEN
Sperm: NONE SEEN
Trich, Wet Prep: NONE SEEN
WBC, Wet Prep HPF POC: >=10 — AB (ref <10)
Yeast Wet Prep HPF POC: NONE SEEN

## 2023-09-13 LAB — URINALYSIS, ROUTINE W REFLEX MICROSCOPIC
Bilirubin Urine: NEGATIVE
Glucose, UA: NEGATIVE mg/dL
Hgb urine dipstick: NEGATIVE
Ketones, ur: 20 mg/dL — AB
Leukocytes,Ua: NEGATIVE
Nitrite: NEGATIVE
Protein, ur: NEGATIVE mg/dL
Specific Gravity, Urine: 1.029 (ref 1.005–1.030)
pH: 5 (ref 5.0–8.0)

## 2023-09-13 LAB — COMPREHENSIVE METABOLIC PANEL WITH GFR
ALT: 17 U/L (ref 0–44)
AST: 17 U/L (ref 15–41)
Albumin: 4.3 g/dL (ref 3.5–5.0)
Alkaline Phosphatase: 74 U/L (ref 38–126)
Anion gap: 10 (ref 5–15)
BUN: 10 mg/dL (ref 6–20)
CO2: 22 mmol/L (ref 22–32)
Calcium: 9.4 mg/dL (ref 8.9–10.3)
Chloride: 105 mmol/L (ref 98–111)
Creatinine, Ser: 0.83 mg/dL (ref 0.44–1.00)
GFR, Estimated: >60 mL/min (ref >60)
Glucose, Bld: 93 mg/dL (ref 70–99)
Potassium: 4.1 mmol/L (ref 3.5–5.1)
Sodium: 137 mmol/L (ref 135–145)
Total Bilirubin: 0.6 mg/dL (ref 0.0–1.2)
Total Protein: 7.2 g/dL (ref 6.5–8.1)

## 2023-09-13 LAB — CBC
HCT: 41.8 % (ref 36.0–46.0)
Hemoglobin: 14.5 g/dL (ref 12.0–15.0)
MCH: 30.9 pg (ref 26.0–34.0)
MCHC: 34.7 g/dL (ref 30.0–36.0)
MCV: 88.9 fL (ref 80.0–100.0)
Platelets: 301 10*3/uL (ref 150–400)
RBC: 4.7 MIL/uL (ref 3.87–5.11)
RDW: 12 % (ref 11.5–15.5)
WBC: 9.1 10*3/uL (ref 4.0–10.5)
nRBC: 0 % (ref 0.0–0.2)

## 2023-09-13 LAB — HCG, QUANTITATIVE, PREGNANCY: hCG, Beta Chain, Quant, S: 3 m[IU]/mL (ref <5)

## 2023-09-13 MED ORDER — ALUM & MAG HYDROXIDE-SIMETH 200-200-20 MG/5ML PO SUSP
30.0000 mL | Freq: Once | ORAL | Status: AC
  Administered 2023-09-13: 30 mL via ORAL
  Filled 2023-09-13: qty 30

## 2023-09-13 MED ORDER — ONDANSETRON 4 MG PO TBDP: 4.0000 mg | ORAL_TABLET | Freq: Three times a day (TID) | ORAL | 0 refills | Status: DC | PRN

## 2023-09-13 MED ORDER — FAMOTIDINE 20 MG PO TABS: 20.0000 mg | ORAL_TABLET | Freq: Two times a day (BID) | ORAL | 0 refills | Status: DC

## 2023-09-13 MED ORDER — ONDANSETRON 4 MG PO TBDP
4.0000 mg | ORAL_TABLET | Freq: Once | ORAL | Status: AC
  Administered 2023-09-13: 4 mg via ORAL
  Filled 2023-09-13: qty 1

## 2023-09-13 MED ORDER — LIDOCAINE VISCOUS HCL 2 % MT SOLN
15.0000 mL | Freq: Once | OROMUCOSAL | Status: AC
  Administered 2023-09-13: 15 mL via ORAL
  Filled 2023-09-13: qty 15

## 2023-09-13 NOTE — MAU Note (Addendum)
 Christie Reyes is a 28 y.o. at [redacted]w[redacted]d, here in MAU reporting: pinkish spotting for past 3 days, none today. Went to health dept yesterday, found out she was pre.  Hadn't had period in 2 months, not unusual for her, irreg cycles since had first baby. No symptoms. Has sharp pains in upper abd,  feels almost like a kidney stone but it is in wrong spot.  Loose stools for a wk, 2-3 times a day.  LMP: 3/15  states hasn't had intercourse since March Onset of complaint: 3 days ago Pain score: just under severe Vitals:   09/13/23 1054  BP: (!) 117/55  Pulse: 91  Resp: 17  Temp: 98.8 F (37.1 C)  SpO2: 100%     Lab orders placed from triage:  urine, vag swabs

## 2023-09-13 NOTE — Discharge Instructions (Addendum)

## 2023-09-13 NOTE — MAU Provider Note (Signed)
 Chief Complaint: Vaginal Bleeding and Abdominal Pain   Event Date/Time   First Provider Initiated Contact with Patient 09/13/23 1119      SUBJECTIVE HPI: Christie Reyes is a 28 y.o. G2P1001 at [redacted]w[redacted]d by LMP who presents to maternity admissions reporting vaginal spotting and upper abdominal pain.  Patient notes having light pink spotting for the past 3 days.  Has not had any today.  She did not have to wear a pad during that time.  Has PCOS and irregular cycles.  She additionally started [facility] about 6 weeks ago.  Has additionally had some loose stools with that.  Did develop upper abdominal/epigastric pain over the past 2 days as well.  Is having some reflux, nausea/vomiting as well.  No fever/chills, loss of fluid, change in discharge, urinary symptoms.  Confirmed pregnancy yesterday at health department but has not had an ultrasound yet.  HPI  Past Medical History:  Diagnosis Date   Acute pyelonephritis 01/16/2019   Anxiety    Depression    Migraine    PCOS (polycystic ovarian syndrome)    Renal stones    Past Surgical History:  Procedure Laterality Date   adnoidectomy and tonsilectomy     TONSILLECTOMY AND ADENOIDECTOMY     Social History   Socioeconomic History   Marital status: Single    Spouse name: Not on file   Number of children: Not on file   Years of education: 12   Highest education level: 12th grade  Occupational History   Not on file  Tobacco Use   Smoking status: Former    Current packs/day: 0.00    Types: Cigarettes    Quit date: 04/05/2021    Years since quitting: 2.4   Smokeless tobacco: Never  Vaping Use   Vaping status: Never Used  Substance and Sexual Activity   Alcohol use: No   Drug use: Not Currently    Types: Marijuana    Comment: Last smoked jan 2023   Sexual activity: Yes    Birth control/protection: Condom  Other Topics Concern   Not on file  Social History Narrative   Not on file   Social Drivers of Health   Financial Resource  Strain: Not on file  Food Insecurity: Not on file  Transportation Needs: Not on file  Physical Activity: Not on file  Stress: Not on file  Social Connections: Unknown (08/15/2021)   Received from [facility]   Social Network    Social Network: Not on file  Intimate Partner Violence: Unknown (07/07/2021)   Received from [facility], [facility]   HITS    Physically Hurt: Not on file    Insult or Talk Down To: Not on file    Threaten Physical Harm: Not on file    Scream or Curse: Not on file   No current facility-administered medications on file prior to encounter.   Current Outpatient Medications on File Prior to Encounter  Medication Sig Dispense Refill   Semaglutide-Weight Management (WEGOVY) 0.5 MG/0.5ML SOAJ Inject 0.5 mg into the skin. Had been on for a month, stopped with preg     EPINEPHrine (EPIPEN JR 2-PAK) 0.15 MG/0.3ML injection Inject into the muscle.     medroxyPROGESTERone  (PROVERA ) 10 MG tablet Take 1 tablet (10 mg total) by mouth daily. Use for ten days if you go 12+ weeks without a period 10 tablet 2   [DISCONTINUED] FLUoxetine  (PROZAC ) 20 MG tablet After 2 weeks of taking 20mg , if needed you can take  this with 20mg  for a total of 40mg /day 15 tablet 3   [DISCONTINUED] levonorgestrel -ethinyl estradiol  (AVIANE) 0.1-20 MG-MCG tablet Take 1 tablet by mouth daily. 1 Package 11   Allergies  Allergen Reactions   Cefdinir Anaphylaxis and Shortness Of Breath   Egg-Derived Products Anaphylaxis   Elemental Sulfur Shortness Of Breath and Swelling   Other Anaphylaxis, Cough and Rash    Dustmites, cats   Sulfa Antibiotics Anaphylaxis and Swelling   Cat Dander    Influenza Vac Typ  [Influenza Virus Vaccine]     Allergy to eggs   Sulfamethoxazole Swelling    ROS:  Pertinent positives/negatives listed above.  I have reviewed patient's Past Medical Hx, Surgical Hx, Family Hx, Social Hx, medications and allergies.   Physical Exam  Patient Vitals for the  past 24 hrs:  BP Temp Temp src Pulse Resp SpO2 Height Weight  09/13/23 1054 (!) 117/55 98.8 F (37.1 C) Oral 91 17 100 % 5' 3 (1.6 m) 81.3 kg   Constitutional: Well-developed, well-nourished female in no acute distress.  Cardiovascular: normal rate Respiratory: normal effort GI: Abd soft, non-tender MS: Extremities nontender, no edema, normal ROM Neurologic: Alert and oriented x 4.  GU: Neg CVAT  LAB RESULTS Results for orders placed or performed during the hospital encounter of 09/13/23 (from the past 24 hours)  Urinalysis, Routine w reflex microscopic -Urine, Clean Catch     Status: Abnormal   Collection Time: 09/13/23 11:13 AM  Result Value Ref Range   Color, Urine YELLOW YELLOW   APPearance CLEAR CLEAR   Specific Gravity, Urine 1.029 1.005 - 1.030   pH 5.0 5.0 - 8.0   Glucose, UA NEGATIVE NEGATIVE mg/dL   Hgb urine dipstick NEGATIVE NEGATIVE   Bilirubin Urine NEGATIVE NEGATIVE   Ketones, ur 20 (A) NEGATIVE mg/dL   Protein, ur NEGATIVE NEGATIVE mg/dL   Nitrite NEGATIVE NEGATIVE   Leukocytes,Ua NEGATIVE NEGATIVE  Wet prep, genital     Status: Abnormal   Collection Time: 09/13/23 11:13 AM   Specimen: PATH Cytology Cervicovaginal Ancillary Only  Result Value Ref Range   Yeast Wet Prep HPF POC NONE SEEN NONE SEEN   Trich, Wet Prep NONE SEEN NONE SEEN   Clue Cells Wet Prep HPF POC NONE SEEN NONE SEEN   WBC, Wet Prep HPF POC >=10 (A) <10   Sperm NONE SEEN   CBC     Status: None   Collection Time: 09/13/23 12:05 PM  Result Value Ref Range   WBC 9.1 4.0 - 10.5 K/uL   RBC 4.70 3.87 - 5.11 MIL/uL   Hemoglobin 14.5 12.0 - 15.0 g/dL   HCT 16.1 09.6 - 04.5 %   MCV 88.9 80.0 - 100.0 fL   MCH 30.9 26.0 - 34.0 pg   MCHC 34.7 30.0 - 36.0 g/dL   RDW 40.9 81.1 - 91.4 %   Platelets 301 150 - 400 K/uL   nRBC 0.0 0.0 - 0.2 %  hCG, quantitative, pregnancy     Status: None   Collection Time: 09/13/23 12:05 PM  Result Value Ref Range   hCG, Beta Chain, Quant, S 3 <5 mIU/mL   Comprehensive metabolic panel     Status: None   Collection Time: 09/13/23 12:05 PM  Result Value Ref Range   Sodium 137 135 - 145 mmol/L   Potassium 4.1 3.5 - 5.1 mmol/L   Chloride 105 98 - 111 mmol/L   CO2 22 22 - 32 mmol/L   Glucose, Bld 93 70 - 99  mg/dL   BUN 10 6 - 20 mg/dL   Creatinine, Ser 0.98 0.44 - 1.00 mg/dL   Calcium 9.4 8.9 - 11.9 mg/dL   Total Protein 7.2 6.5 - 8.1 g/dL   Albumin 4.3 3.5 - 5.0 g/dL   AST 17 15 - 41 U/L   ALT 17 0 - 44 U/L   Alkaline Phosphatase 74 38 - 126 U/L   Total Bilirubin 0.6 0.0 - 1.2 mg/dL   GFR, Estimated >14 >78 mL/min   Anion gap 10 5 - 15       IMAGING US  OB LESS THAN 14 WEEKS WITH OB TRANSVAGINAL Result Date: 09/13/2023 CLINICAL DATA:  Vaginal spotting x2 days. EXAM: OBSTETRIC <14 WK US  AND TRANSVAGINAL OB US  TECHNIQUE: Both transabdominal and transvaginal ultrasound examinations were performed for complete evaluation of the gestation as well as the maternal uterus, adnexal regions, and pelvic cul-de-sac. Transvaginal technique was performed to assess early pregnancy. COMPARISON:  None Available. FINDINGS: Intrauterine gestational sac: Single Yolk sac:  Visualized. Embryo:  Not Visualized. Cardiac Activity: Not Visualized. Heart Rate: N/A  bpm MSD: 8.3 mm   5 w   4 d Subchorionic hemorrhage:  Small (5.3 mm x 3.9 mm) Maternal uterus/adnexae: The right ovary measures 2.9 cm x 2.6 cm x 2.9 cm and is normal in appearance. The left ovary is not visualized. No pelvic free fluid is seen. IMPRESSION: Probable early intrauterine gestational sac and yolk sac, but no fetal pole or cardiac activity yet visualized. Recommend follow-up quantitative B-HCG levels and follow-up US  in 14 days to assess viability. This recommendation follows SRU consensus guidelines: Diagnostic Criteria for Nonviable Pregnancy Early in the First Trimester. Mel Spine Med 2013; 295:6213-08. Electronically Signed   By: Virgle Grime M.D.   On: 09/13/2023 12:47    MAU  Management/MDM: Orders Placed This Encounter  Procedures   Wet prep, genital   US  OB LESS THAN 14 WEEKS WITH OB TRANSVAGINAL   US  OB LESS THAN 14 WEEKS WITH OB TRANSVAGINAL   Urinalysis, Routine w reflex microscopic -Urine, Clean Catch   CBC   hCG, quantitative, pregnancy   Comprehensive metabolic panel   Discharge patient    Meds ordered this encounter  Medications   ondansetron  (ZOFRAN -ODT) disintegrating tablet 4 mg   AND Linked Order Group    alum & mag hydroxide-simeth (MAALOX/MYLANTA) 200-200-20 MG/5ML suspension 30 mL    lidocaine  (XYLOCAINE ) 2 % viscous mouth solution 15 mL   famotidine (PEPCID) 20 MG tablet    Sig: Take 1 tablet (20 mg total) by mouth 2 (two) times daily.    Dispense:  60 tablet    Refill:  0   ondansetron  (ZOFRAN -ODT) 4 MG disintegrating tablet    Sig: Take 1 tablet (4 mg total) by mouth every 8 (eight) hours as needed.    Dispense:  30 tablet    Refill:  0    Patient presents with vaginal spotting as well as upper abdominal pain in setting of early pregnancy.  Suspect that upper abdominal pain is epigastric in nature with acid reflux and vomiting from early pregnancy.  Will additionally need to rule out ectopic pregnancy although I think this is less likely cause for her symptoms.  Will obtain vaginal swabs and urine to assess for causes of spotting as well.  Results returned.  Showed a 6-week pregnancy with a gestational sac and yolk sac.  Additionally showed subchorionic hematoma which accounts for the spotting.  Educated patient on this finding.  Additionally  patient's symptoms resolved with viscous lidocaine , so will send with Zofran  for nausea and Pepcid for reflux.  Advised to establish with a OB provider and list of providers given.  Additionally instructed to stop taking Wegovy.  Of note, patient's hCG level returned at 3.  This is inconsistent with clinical history, positive pregnancy test yesterday, and ultrasound findings.  Will obtain repeat  viability scan in approximately 2 weeks to assess pregnancy giving early gestation.                ASSESSMENT 1. Gastroesophageal reflux in pregnancy   2. Vomiting pregnancy     PLAN Discharge home with strict return precautions. Allergies as of 09/13/2023       Reactions   Cefdinir Anaphylaxis, Shortness Of Breath   Egg-derived Products Anaphylaxis   Elemental Sulfur Shortness Of Breath, Swelling   Other Anaphylaxis, Cough, Rash   Dustmites, cats   Sulfa Antibiotics Anaphylaxis, Swelling   Cat Dander    Influenza Vac Typ  [influenza Virus Vaccine]    Allergy to eggs   Sulfamethoxazole Swelling        Medication List     STOP taking these medications    medroxyPROGESTERone  10 MG tablet Commonly known as: PROVERA    Wegovy 0.5 MG/0.5ML Soaj Generic drug: Semaglutide-Weight Management       TAKE these medications    EpiPen Jr 2-Pak 0.15 MG/0.3ML injection Generic drug: EPINEPHrine Inject into the muscle.   famotidine 20 MG tablet Commonly known as: Pepcid Take 1 tablet (20 mg total) by mouth 2 (two) times daily.   ondansetron  4 MG disintegrating tablet Commonly known as: ZOFRAN -ODT Take 1 tablet (4 mg total) by mouth every 8 (eight) hours as needed.         Authur Leghorn, MD OB Fellow 09/13/2023  1:58 PM

## 2023-09-14 LAB — GC/CHLAMYDIA PROBE AMP (~~LOC~~) NOT AT ARMC
Chlamydia: NEGATIVE
Comment: NEGATIVE
Comment: NORMAL
Neisseria Gonorrhea: NEGATIVE

## 2023-09-15 ENCOUNTER — Other Ambulatory Visit

## 2023-09-15 DIAGNOSIS — Z3200 Encounter for pregnancy test, result unknown: Secondary | ICD-10-CM

## 2023-09-16 ENCOUNTER — Ambulatory Visit: Payer: Self-pay | Admitting: Obstetrics and Gynecology

## 2023-09-16 LAB — BETA HCG QUANT (REF LAB): hCG Quant: 19448 m[IU]/mL

## 2023-09-18 ENCOUNTER — Telehealth: Payer: Self-pay | Admitting: *Deleted

## 2023-09-18 NOTE — Telephone Encounter (Signed)
 TC from patient asking for clarification on results of US  in MAU on 09/13/23 and bHCG values. US  was reviewed with patient by provider on that day, but pt is verbalizing confusion and concern for SAB and need to have an abortion if her repeat US  is not good. Reports provider informed her of above. I reassured the pt the the US  findings were c/w a pregnancy that is too early to see a viable embryo. I assured her that her rising bHCG levels was a good sign that the pregnancy is viable and that all that is needed is a repeat US  at the 2 week interval as planned by the MAU provider. Due to limited availability of timely OB US  <14 weeks, schedulers are seeking the best option and we will f/u with the pt when we have a date and time for her.

## 2023-09-19 ENCOUNTER — Other Ambulatory Visit

## 2023-09-25 ENCOUNTER — Ambulatory Visit

## 2023-09-25 ENCOUNTER — Other Ambulatory Visit (INDEPENDENT_AMBULATORY_CARE_PROVIDER_SITE_OTHER): Payer: Self-pay

## 2023-09-25 VITALS — BP 120/78 | HR 81

## 2023-09-25 DIAGNOSIS — Z3A01 Less than 8 weeks gestation of pregnancy: Secondary | ICD-10-CM | POA: Diagnosis not present

## 2023-09-25 DIAGNOSIS — O3680X0 Pregnancy with inconclusive fetal viability, not applicable or unspecified: Secondary | ICD-10-CM

## 2023-09-25 DIAGNOSIS — O09299 Supervision of pregnancy with other poor reproductive or obstetric history, unspecified trimester: Secondary | ICD-10-CM | POA: Insufficient documentation

## 2023-09-25 DIAGNOSIS — Z3481 Encounter for supervision of other normal pregnancy, first trimester: Secondary | ICD-10-CM | POA: Diagnosis not present

## 2023-09-25 DIAGNOSIS — Z3480 Encounter for supervision of other normal pregnancy, unspecified trimester: Secondary | ICD-10-CM | POA: Insufficient documentation

## 2023-09-25 MED ORDER — PREPLUS 27-1 MG PO TABS: 1.0000 | ORAL_TABLET | Freq: Every day | ORAL | 13 refills | Status: AC

## 2023-09-25 MED ORDER — METOCLOPRAMIDE HCL 10 MG PO TABS: 10.0000 mg | ORAL_TABLET | Freq: Four times a day (QID) | ORAL | 2 refills | Status: DC | PRN

## 2023-09-25 NOTE — Progress Notes (Signed)
 New OB Intake  I explained I am completing New OB Intake today. We discussed EDD of 05/11/2024, by Ultrasound. Pt is G2P1001. I reviewed her allergies, medications and Medical/Surgical/OB history.    Patient Active Problem List   Diagnosis Date Noted   Supervision of other normal pregnancy, antepartum 09/25/2023   History of oligohydramnios in prior pregnancy, currently pregnant 09/25/2023    Concerns addressed today  Patient informed that the ultrasound is considered a limited obstetric ultrasound and is not intended to be a complete ultrasound exam.  Patient also informed that the ultrasound is not being completed with the intent of assessing for fetal or placental anomalies or any pelvic abnormalities. Explained that the purpose of today's ultrasound is to assess for viability.  Patient acknowledges the purpose of the exam and the limitations of the study.     Delivery Plans Plans to deliver at [facility]. Discussed the nature of our practice with multiple providers including residents and students. Due to the size of the practice, the delivering provider may not be the same as those providing prenatal care.   MyChart/Babyscripts MyChart access verified. I explained pt will have some visits in office and some virtually. Babyscripts app discussed and ordered.   Blood Pressure Cuff Blood pressure cuff discussed and givenDiscussed to be used for virtual visits and or if needed BP checks weekly.  Anatomy US  Explained first scheduled US  will be around 19 weeks.   Last Pap Diagnosis  Date Value Ref Range Status  03/17/2021 (A)  Final   - Atypical squamous cells of undetermined significance (ASC-US )    First visit review I reviewed new OB appt with patient. Explained pt will be seen by Dr Fredirick at first visit. Discussed [place] genetic screening with patient will get Panorama, had Horizon with last pregnancy. Routine prenatal labs to be collected at [facility].    Wanda Buckles,  RN 09/25/2023  3:51 PM

## 2023-09-26 ENCOUNTER — Other Ambulatory Visit

## 2023-10-20 ENCOUNTER — Inpatient Hospital Stay (HOSPITAL_COMMUNITY)
Admission: AD | Admit: 2023-10-20 | Discharge: 2023-10-20 | Disposition: A | Discharge status: Home / Self Care | Attending: Obstetrics & Gynecology | Admitting: Obstetrics & Gynecology

## 2023-10-20 ENCOUNTER — Encounter (HOSPITAL_COMMUNITY): Payer: Self-pay | Admitting: Obstetrics and Gynecology

## 2023-10-20 DIAGNOSIS — R109 Unspecified abdominal pain: Secondary | ICD-10-CM | POA: Diagnosis present

## 2023-10-20 DIAGNOSIS — O26891 Other specified pregnancy related conditions, first trimester: Secondary | ICD-10-CM | POA: Diagnosis present

## 2023-10-20 DIAGNOSIS — Z3A10 10 weeks gestation of pregnancy: Secondary | ICD-10-CM

## 2023-10-20 DIAGNOSIS — N898 Other specified noninflammatory disorders of vagina: Secondary | ICD-10-CM | POA: Diagnosis not present

## 2023-10-20 LAB — WET PREP, GENITAL
Clue Cells Wet Prep HPF POC: NONE SEEN
Sperm: NONE SEEN
Trich, Wet Prep: NONE SEEN
WBC, Wet Prep HPF POC: <10 (ref <10)

## 2023-10-20 NOTE — MAU Provider Note (Signed)
 Chief Complaint:  Vaginal Discharge and Abdominal Pain   HPI   Event Date/Time   First Provider Initiated Contact with Patient 10/20/23 1733      Christie Reyes is a 28 y.o. G2P1001 at [redacted]w[redacted]d who presents to maternity admissions reporting that at 1530 this afternoon she noticed that her clothes were wet and she thought she may have broken her water at [redacted] weeks pregnant.  She denies any vaginal bleeding she has some mild abdominal cramping.  Denies any recent sexual activity.  She has an IUP documented  Patient denies any urinary signs or symptoms vaginal irritation burning or itching.   Pregnancy Course: [facility]  Past Medical History:  Diagnosis Date   Abnormal antenatal AFP screen 08/02/2021   Acute pyelonephritis 01/16/2019   Anxiety    ASCUS with positive high risk HPV cervical 11/20/2019   Depression    Herpes zoster without complication 03/17/2021   Migraine    PCOS (polycystic ovarian syndrome)    Renal stones    OB History  Gravida Para Term Preterm AB Living  2 1 1   1   SAB IAB Ectopic Multiple Live Births      1    # Outcome Date GA Lbr Len/2nd Weight Sex Type Anes PTL Lv  2 Current           1 Term 09/20/21 [redacted]w[redacted]d  3060 g F Vag-Spont EPI  LIV    Obstetric Comments  2023 oligo   Past Surgical History:  Procedure Laterality Date   adnoidectomy and tonsilectomy     TONSILLECTOMY AND ADENOIDECTOMY     Family History  Problem Relation Age of Onset   Diabetes Mother    Hypertension Mother    Cancer Father        brain   Diabetes Father    Diabetes Paternal Grandmother    Social History   Tobacco Use   Smoking status: Former    Current packs/day: 0.00    Types: Cigarettes    Quit date: 04/05/2021    Years since quitting: 2.5   Smokeless tobacco: Never  Vaping Use   Vaping status: Never Used  Substance Use Topics   Alcohol use: No   Drug use: Not Currently    Types: Marijuana    Comment: Last smoked jan 2023   Allergies  Allergen Reactions    Cefdinir Anaphylaxis and Shortness Of Breath   Egg-Derived Products Anaphylaxis   Elemental Sulfur Shortness Of Breath and Swelling   Other Anaphylaxis, Cough and Rash    Dustmites, cats   Sulfa Antibiotics Anaphylaxis and Swelling   Cat Dander    Influenza Vac Typ  [Influenza Virus Vaccine]     Allergy to eggs   Sulfamethoxazole Swelling   No medications prior to admission.    I have reviewed patient's Past Medical Hx, Surgical Hx, Family Hx, Social Hx, medications and allergies.   ROS  Pertinent items noted in HPI and remainder of comprehensive ROS otherwise negative.   PHYSICAL EXAM  Patient Vitals for the past 24 hrs:  BP Temp Temp src Pulse Resp SpO2 Height Weight  10/20/23 1705 113/64 98.9 F (37.2 C) Oral 86 18 99 % 5' 3 (1.6 m) 77.9 kg    Constitutional: Well-developed, well-nourished female in no acute distress.  Cardiovascular: normal rate & rhythm, warm and well-perfused Respiratory: normal effort, no problems with respiration noted GI: Abd soft, non-tender MS: Extremities nontender, no edema, normal ROM Neurologic: Alert and oriented x 4.  GU: no CVA tenderness Pelvic: Exam chaperoned by Lorice Montenegro RN No pooling, scant vaginal discharge, cervix visually closed, no evidence of vaginal bleeding.     Pt informed that the ultrasound is considered a limited OB ultrasound and is not intended to be a complete ultrasound exam.  Patient also informed that the ultrasound is not being completed with the intent of assessing for fetal or placental anomalies or any pelvic abnormalities.  Explained that the purpose of today's ultrasound is to assess for  FHR @ 152 bpm  and viability.  Patient acknowledges the purpose of the exam and the limitations of the study.   Good FM's observed with FHR via Ultrasound doppler @ 152 bpm   Labs: Results for orders placed or performed during the hospital encounter of 10/20/23 (from the past 24 hours)  Wet prep, genital     Status:  Abnormal   Collection Time: 10/20/23  5:51 PM  Result Value Ref Range   Yeast Wet Prep HPF POC PRESENT (A) NONE SEEN   Trich, Wet Prep NONE SEEN NONE SEEN   Clue Cells Wet Prep HPF POC NONE SEEN NONE SEEN   WBC, Wet Prep HPF POC <10 <10   Sperm NONE SEEN       MDM & MAU COURSE  MDM:  MODERATE   Prenatal records reviewed Physical exam performed with speculum exam Vaginal cultures obtained Bedside ultrasound performed for fetal heart rate and maternal reassurance No evidence of rupture on exam  Bedside ultrasound reassuring with IUP and  documented fetal heart rate and visible fetal movements by me and the patient acknowledged the FHR and movement   Patient wishes to be called if any abnormal cultures and or through MyChart  Will plan for discharge and OB f/u as scheduled   MAU Course: Orders Placed This Encounter  Procedures   Wet prep, genital   Discharge patient Discharge disposition: 01-Home or Self Care; Discharge patient date: 10/20/2023   ASSESSMENT   1. Vaginal discharge during pregnancy in first trimester   2. [redacted] weeks gestation of pregnancy     PLAN  Discharge home in stable condition with return precautions.   See AVS for full description of information given to the patient including both verbal and written. Patient verbalized understanding and agrees with the plan as described above.     Follow-up Information     [facility] for [facility] Follow up.   Specialty: Obstetrics and Gynecology Why: If symptoms worsen or fail to resolve, As scheduled for ongoing prenatal care Contact information: [address] [place]  [phone]                Allergies as of 10/20/2023       Reactions   Cefdinir Anaphylaxis, Shortness Of Breath   Egg-derived Products Anaphylaxis   Elemental Sulfur Shortness Of Breath, Swelling   Other Anaphylaxis, Cough, Rash   Dustmites, cats   Sulfa  Antibiotics Anaphylaxis, Swelling   Cat Dander    Influenza Vac Typ  [influenza Virus Vaccine]    Allergy to eggs   Sulfamethoxazole Swelling        Medication List     TAKE these medications    EpiPen Jr 2-Pak 0.15 MG/0.3ML injection Generic drug: EPINEPHrine Inject into the muscle.   famotidine  20 MG tablet Commonly known as: Pepcid  Take 1 tablet (20 mg total) by mouth 2 (two) times daily.   metoCLOPramide  10 MG tablet Commonly  known as: REGLAN  Take 1 tablet (10 mg total) by mouth 4 (four) times daily as needed for nausea or vomiting.   ondansetron  4 MG disintegrating tablet Commonly known as: ZOFRAN -ODT Take 1 tablet (4 mg total) by mouth every 8 (eight) hours as needed.   PrePLUS 27-1 MG Tabs Take 1 tablet by mouth daily.       Olam Dalton, MSN, [facility], Center for [employer]

## 2023-10-20 NOTE — Discharge Instructions (Signed)
 Follow up with your [facility] Provider

## 2023-10-20 NOTE — MAU Note (Signed)
 Christie Reyes is a 28 y.o. at [redacted]w[redacted]d here in MAU reporting: around 1530, had a gush of clear fluid. Had to change her clothes and there was a puddle on the floor. Since then she has had abd pain. Pain is both sides of her lower stomach and in her back.   Fluid is still coming, not as bad as before, now has just kind of messed up her underwear. No bleeding. Denies recent intercourse.   Onset of complaint: 1530 Pain score: mod Vitals:   10/20/23 1705  BP: 113/64  Pulse: 86  Resp: 18  Temp: 98.9 F (37.2 C)  SpO2: 99%     QYU:lwjaoz to hear Lab orders placed from triage:

## 2023-10-23 LAB — GC/CHLAMYDIA PROBE AMP (~~LOC~~) NOT AT ARMC
Chlamydia: NEGATIVE
Comment: NEGATIVE
Comment: NORMAL
Neisseria Gonorrhea: NEGATIVE

## 2023-10-24 ENCOUNTER — Encounter: Payer: Self-pay | Admitting: Family Medicine

## 2023-10-24 ENCOUNTER — Ambulatory Visit (INDEPENDENT_AMBULATORY_CARE_PROVIDER_SITE_OTHER): Admitting: Family Medicine

## 2023-10-24 VITALS — BP 121/80 | HR 91 | Wt 174.0 lb

## 2023-10-24 DIAGNOSIS — G43909 Migraine, unspecified, not intractable, without status migrainosus: Secondary | ICD-10-CM | POA: Diagnosis not present

## 2023-10-24 DIAGNOSIS — Z3480 Encounter for supervision of other normal pregnancy, unspecified trimester: Secondary | ICD-10-CM

## 2023-10-24 DIAGNOSIS — F419 Anxiety disorder, unspecified: Secondary | ICD-10-CM | POA: Insufficient documentation

## 2023-10-24 DIAGNOSIS — O219 Vomiting of pregnancy, unspecified: Secondary | ICD-10-CM

## 2023-10-24 DIAGNOSIS — O99341 Other mental disorders complicating pregnancy, first trimester: Secondary | ICD-10-CM | POA: Diagnosis not present

## 2023-10-24 DIAGNOSIS — Z3A11 11 weeks gestation of pregnancy: Secondary | ICD-10-CM

## 2023-10-24 MED ORDER — ONDANSETRON 4 MG PO TBDP: 4.0000 mg | ORAL_TABLET | Freq: Three times a day (TID) | ORAL | 0 refills | Status: DC | PRN

## 2023-10-24 MED ORDER — HYDROXYZINE HCL 10 MG PO TABS: 10.0000 mg | ORAL_TABLET | Freq: Three times a day (TID) | ORAL | 0 refills | Status: DC | PRN

## 2023-10-24 MED ORDER — MICONAZOLE 3 200 MG VA SUPP: 200.0000 mg | Freq: Every day | VAGINAL | 0 refills | Status: DC

## 2023-10-24 MED ORDER — BUSPIRONE HCL 7.5 MG PO TABS: 7.5000 mg | ORAL_TABLET | Freq: Two times a day (BID) | ORAL | 3 refills | Status: DC

## 2023-10-24 NOTE — Progress Notes (Signed)
 New OB   Last pap: 03/17/21. Recent MAU for leaking everything was fine.  Pt declines pap today wants to wait PP had GC/CT done at MAU 10/20/23 Neg.  CC: Lower Pelvic pain/back pain , severe HA's notes hx everyday hx of Migraines. Taking tylenol 

## 2023-10-24 NOTE — Progress Notes (Signed)
 Subjective:   Christie Reyes is a 28 y.o. G2P1001 at [redacted]w[redacted]d by early ultrasound being seen today for her first obstetrical visit.  Her obstetrical history is significant for oligohydramnios. Pregnancy history fully reviewed.  Patient reports headache, nausea, and vomiting.  HISTORY: OB History  Gravida Para Term Preterm AB Living  2 1 1  0 0 1  SAB IAB Ectopic Multiple Live Births  0 0 0 0 1    # Outcome Date GA Lbr Len/2nd Weight Sex Type Anes PTL Lv  2 Current           1 Term 09/20/21 [redacted]w[redacted]d  6 lb 11.9 oz (3.06 kg) F Vag-Spont EPI  LIV     Name: KAREL LEAR JANSKY     Apgar1: 9  Apgar5: 9    Obstetric Comments  2023 oligo   Last pap smear was  03/17/2021 and was normal Past Medical History:  Diagnosis Date   Abnormal antenatal AFP screen 08/02/2021   Acute pyelonephritis 01/16/2019   Anxiety    ASCUS with positive high risk HPV cervical 11/20/2019   Depression    Herpes zoster without complication 03/17/2021   Migraine    PCOS (polycystic ovarian syndrome)    Renal stones    Past Surgical History:  Procedure Laterality Date   adnoidectomy and tonsilectomy     TONSILLECTOMY AND ADENOIDECTOMY     WISDOM TOOTH EXTRACTION     Family History  Problem Relation Age of Onset   Diabetes Mother    Hypertension Mother    Cancer Father        brain   Diabetes Father    Diabetes Paternal Grandmother    Social History   Tobacco Use   Smoking status: Former    Current packs/day: 0.00    Types: Cigarettes    Quit date: 04/05/2021    Years since quitting: 2.5   Smokeless tobacco: Never  Vaping Use   Vaping status: Never Used  Substance Use Topics   Alcohol use: No   Drug use: Not Currently    Types: Marijuana    Comment: Last smoked jan 2023   Allergies  Allergen Reactions   Cefdinir Anaphylaxis and Shortness Of Breath   Egg-Derived Products Anaphylaxis   Elemental Sulfur Shortness Of Breath and Swelling   Other Anaphylaxis, Cough and Rash     Dustmites, cats   Sulfa Antibiotics Anaphylaxis and Swelling   Cat Dander    Influenza Vac Typ  [Influenza Virus Vaccine]     Allergy to eggs   Sulfamethoxazole Swelling   Current Outpatient Medications on File Prior to Visit  Medication Sig Dispense Refill   EPINEPHrine (EPIPEN JR 2-PAK) 0.15 MG/0.3ML injection Inject into the muscle.     famotidine  (PEPCID ) 20 MG tablet Take 1 tablet (20 mg total) by mouth 2 (two) times daily. 60 tablet 0   Prenatal Vit-Fe Fumarate-FA (PREPLUS) 27-1 MG TABS Take 1 tablet by mouth daily. 30 tablet 13   [DISCONTINUED] FLUoxetine  (PROZAC ) 20 MG tablet After 2 weeks of taking 20mg , if needed you can take this with 20mg  for a total of 40mg /day 15 tablet 3   [DISCONTINUED] levonorgestrel -ethinyl estradiol  (AVIANE) 0.1-20 MG-MCG tablet Take 1 tablet by mouth daily. 1 Package 11   No current facility-administered medications on file prior to visit.     Exam   Vitals:   10/24/23 1317  BP: 121/80  Pulse: 91  Weight: 174 lb (78.9 kg)      System: General:  well-developed, well-nourished female in no acute distress   Skin: normal coloration and turgor, no rashes   Neurologic: oriented, normal, negative, normal mood   Extremities: normal strength, tone, and muscle mass, ROM of all joints is normal   HEENT PERRLA, extraocular movement intact and sclera clear, anicteric   Mouth/Teeth mucous membranes moist, pharynx normal without lesions and dental hygiene good   Neck supple and no masses   Cardiovascular: regular rate and rhythm   Respiratory:  no respiratory distress, normal breath sounds   Abdomen: soft, non-tender; bowel sounds normal; no masses,  no organomegaly     Assessment:   Pregnancy: G2P1001 Patient Active Problem List   Diagnosis Date Noted   Anxiety    Supervision of other normal pregnancy, antepartum 09/25/2023   History of oligohydramnios in prior pregnancy, currently pregnant 09/25/2023   Migraine 03/12/2012     Plan:  1.  Supervision of other normal pregnancy, antepartum (Primary) New O B labs today - CBC/D/Plt+RPR+Rh+ABO+RubIgG... - Culture, OB Urine - Hemoglobin A1c - Comprehensive metabolic panel with GFR - PANORAMA PRENATAL TEST  2. [redacted] weeks gestation of pregnancy   3. Anxiety Has significant anxiety--trial of Buspar  and Vistaril  for panic attacks. - busPIRone  (BUSPAR ) 7.5 MG tablet; Take 1 tablet (7.5 mg total) by mouth 2 (two) times daily.  Dispense: 90 tablet; Refill: 3 - hydrOXYzine  (ATARAX ) 10 MG tablet; Take 1 tablet (10 mg total) by mouth 3 (three) times daily as needed.  Dispense: 30 tablet; Refill: 0  4. Nausea/vomiting in pregnancy Refilled her Zofran  - ondansetron  (ZOFRAN -ODT) 4 MG disintegrating tablet; Take 1 tablet (4 mg total) by mouth every 8 (eight) hours as needed.  Dispense: 30 tablet; Refill: 0  5. Migraine without status migrainosus, not intractable, unspecified migraine type Trial of excedrine migraine  Initial labs drawn. Continue prenatal vitamins. Genetic Screening discussed, NIPS: ordered. Ultrasound discussed; fetal anatomic survey: ordered. Problem list reviewed and updated.  Routine obstetric precautions reviewed. Return in 4 weeks (on 11/21/2023).

## 2023-10-25 ENCOUNTER — Ambulatory Visit: Payer: Self-pay | Admitting: Family Medicine

## 2023-10-25 DIAGNOSIS — Z3480 Encounter for supervision of other normal pregnancy, unspecified trimester: Secondary | ICD-10-CM

## 2023-10-25 DIAGNOSIS — Z2839 Other underimmunization status: Secondary | ICD-10-CM | POA: Insufficient documentation

## 2023-10-25 LAB — CBC/D/PLT+RPR+RH+ABO+RUBIGG...
Antibody Screen: NEGATIVE
Basophils Absolute: 0 x10E3/uL (ref 0.0–0.2)
Basos: 0 %
EOS (ABSOLUTE): 0.1 x10E3/uL (ref 0.0–0.4)
Eos: 1 %
HCV Ab: NONREACTIVE
HIV Screen 4th Generation wRfx: NONREACTIVE
Hematocrit: 41.2 % (ref 34.0–46.6)
Hemoglobin: 13.7 g/dL (ref 11.1–15.9)
Hepatitis B Surface Ag: NEGATIVE
Immature Grans (Abs): 0 x10E3/uL (ref 0.0–0.1)
Immature Granulocytes: 0 %
Lymphocytes Absolute: 1.8 x10E3/uL (ref 0.7–3.1)
Lymphs: 18 %
MCH: 31 pg (ref 26.6–33.0)
MCHC: 33.3 g/dL (ref 31.5–35.7)
MCV: 93 fL (ref 79–97)
Monocytes Absolute: 0.6 x10E3/uL (ref 0.1–0.9)
Monocytes: 6 %
Neutrophils Absolute: 7.5 x10E3/uL — ABNORMAL HIGH (ref 1.4–7.0)
Neutrophils: 75 %
Platelets: 341 x10E3/uL (ref 150–450)
RBC: 4.42 x10E6/uL (ref 3.77–5.28)
RDW: 12.5 % (ref 11.7–15.4)
RPR Ser Ql: NONREACTIVE
Rh Factor: POSITIVE
Rubella Antibodies, IGG: <0.9 {index} — ABNORMAL LOW (ref >0.99)
WBC: 10 x10E3/uL (ref 3.4–10.8)

## 2023-10-25 LAB — COMPREHENSIVE METABOLIC PANEL WITH GFR
ALT: 11 IU/L (ref 0–32)
AST: 9 IU/L (ref 0–40)
Albumin: 4.3 g/dL (ref 4.0–5.0)
Alkaline Phosphatase: 76 IU/L (ref 44–121)
BUN/Creatinine Ratio: 9 (ref 9–23)
BUN: 7 mg/dL (ref 6–20)
Bilirubin Total: 0.3 mg/dL (ref 0.0–1.2)
CO2: 20 mmol/L (ref 20–29)
Calcium: 9.6 mg/dL (ref 8.7–10.2)
Chloride: 102 mmol/L (ref 96–106)
Creatinine, Ser: 0.76 mg/dL (ref 0.57–1.00)
Globulin, Total: 2.3 g/dL (ref 1.5–4.5)
Glucose: 72 mg/dL (ref 70–99)
Potassium: 4 mmol/L (ref 3.5–5.2)
Sodium: 140 mmol/L (ref 134–144)
Total Protein: 6.6 g/dL (ref 6.0–8.5)
eGFR: 111 mL/min/1.73 (ref >59)

## 2023-10-25 LAB — HEMOGLOBIN A1C
Est. average glucose Bld gHb Est-mCnc: 105 mg/dL
Hgb A1c MFr Bld: 5.3 % (ref 4.8–5.6)

## 2023-10-25 LAB — HCV INTERPRETATION

## 2023-10-28 LAB — CULTURE, OB URINE

## 2023-10-28 LAB — URINE CULTURE, OB REFLEX

## 2023-10-29 LAB — PANORAMA PRENATAL TEST FULL PANEL:PANORAMA TEST PLUS 5 ADDITIONAL MICRODELETIONS: FETAL FRACTION: 10.6

## 2023-11-02 ENCOUNTER — Other Ambulatory Visit: Payer: Self-pay | Admitting: Family Medicine

## 2023-11-02 DIAGNOSIS — G43909 Migraine, unspecified, not intractable, without status migrainosus: Secondary | ICD-10-CM

## 2023-11-02 MED ORDER — CYCLOBENZAPRINE HCL 10 MG PO TABS: 10.0000 mg | ORAL_TABLET | Freq: Three times a day (TID) | ORAL | 1 refills | Status: DC | PRN

## 2023-11-21 ENCOUNTER — Encounter: Admitting: Obstetrics & Gynecology

## 2023-11-22 ENCOUNTER — Ambulatory Visit (INDEPENDENT_AMBULATORY_CARE_PROVIDER_SITE_OTHER): Admitting: Certified Nurse Midwife

## 2023-11-22 ENCOUNTER — Encounter: Payer: Self-pay | Admitting: Certified Nurse Midwife

## 2023-11-22 VITALS — BP 121/68 | HR 101 | Wt 177.0 lb

## 2023-11-22 DIAGNOSIS — O26892 Other specified pregnancy related conditions, second trimester: Secondary | ICD-10-CM | POA: Diagnosis not present

## 2023-11-22 DIAGNOSIS — R519 Headache, unspecified: Secondary | ICD-10-CM

## 2023-11-22 DIAGNOSIS — Z8669 Personal history of other diseases of the nervous system and sense organs: Secondary | ICD-10-CM

## 2023-11-22 DIAGNOSIS — Z3A15 15 weeks gestation of pregnancy: Secondary | ICD-10-CM

## 2023-11-22 DIAGNOSIS — Z3492 Encounter for supervision of normal pregnancy, unspecified, second trimester: Secondary | ICD-10-CM

## 2023-11-22 MED ORDER — ACETAMINOPHEN-CAFFEINE 500-65 MG PO TABS: 2.0000 | ORAL_TABLET | Freq: Four times a day (QID) | ORAL | 0 refills | Status: DC | PRN

## 2023-11-22 MED ORDER — SUMATRIPTAN SUCCINATE 100 MG PO TABS
100.0000 mg | ORAL_TABLET | Freq: Once | ORAL | 11 refills | Status: DC | PRN
Start: 2023-11-22 — End: 2023-11-22

## 2023-11-22 NOTE — Progress Notes (Signed)
 ROB   CC: Migraines , no relief w/ Tylenol  or Tylenol  extra strength.  Notes Hx of migraines . Pain will be so severe pt will be in tears.   Pt made aware we have HA specialist here. Pt interested in seeing Darice PA.  AFP today

## 2023-11-22 NOTE — Progress Notes (Signed)
   PRENATAL VISIT NOTE  Subjective:  Christie Reyes is a 28 y.o. G2P1001 at [redacted]w[redacted]d being seen today for ongoing prenatal care.  She is currently monitored for the following issues for this low-risk pregnancy and has Migraine; Supervision of other normal pregnancy, antepartum; History of oligohydramnios in prior pregnancy, currently pregnant; Anxiety; and Rubella non-immune status, antepartum on their problem list.  Patient reports headache.  Contractions: Not present. Vag. Bleeding: None.  Movement: Absent. Denies leaking of fluid.   The following portions of the patient's history were reviewed and updated as appropriate: allergies, current medications, past family history, past medical history, past social history, past surgical history and problem list.   Objective:    Vitals:   11/22/23 1349  BP: 121/68  Pulse: (!) 101  Weight: 177 lb (80.3 kg)    Fetal Status:  Fetal Heart Rate (bpm): 140   Movement: Absent    General: Alert, oriented and cooperative. Patient is in no acute distress.  Skin: Skin is warm and dry. No rash noted.   Cardiovascular: Normal heart rate noted  Respiratory: Normal respiratory effort, no problems with respiration noted  Abdomen: Soft, gravid, appropriate for gestational age.  Pain/Pressure: Present     Pelvic: Cervical exam deferred        Extremities: Normal range of motion.  Edema: None  Mental Status: Normal mood and affect. Normal behavior. Normal judgment and thought content.   Assessment and Plan:  Pregnancy: G2P1001 at [redacted]w[redacted]d 1. Encounter for supervision of low-risk pregnancy in second trimester (Primary) - Patient doing well.  - Has not felt movement yet, reviewed fetal movement expectations for 2nd trimester.   2. [redacted] weeks gestation of pregnancy - AFP, Serum, Open Spina Bifida  3. History of migraine - Previously on naratriptan prior to pregnancy   4. Pregnancy headache in second trimester - Has tried tylenol  and flexeril  without relief.   - order for Excedrin tension placed.  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 Recommended blue light protection on phone and computer screens - Discussed the use of Imitrex  if necessary. Also referral to HA clinic.   Preterm labor symptoms and general obstetric precautions including but not limited to vaginal bleeding, contractions, leaking of fluid and fetal movement were reviewed in detail with the patient. Please refer to After Visit Summary for other counseling recommendations.   Return in about 4 weeks (around 12/20/2023) for LOB, With CNM or MD .  Future Appointments  Date Time Provider Department Center  12/19/2023  8:00 AM [facility] PROVIDER 1 [facility]  12/19/2023  8:30 AM [facility] US1 [facility]  12/19/2023 11:15 AM Izell Harari, MD [facility]  01/03/2024 10:35 AM Emilio Delilah CHRISTELLA, CNM [facility]  01/31/2024 10:35 AM Letha Renshaw, CNM [facility]    Mckenze Slone Erven) Emilio, MSN, CNM  [facility]  11/22/2023 2:51 PM

## 2023-11-24 LAB — AFP, SERUM, OPEN SPINA BIFIDA
AFP MoM: 1.25
AFP Value: 34.4 ng/mL
Gest. Age on Collection Date: 15.4 wk
Maternal Age At EDD: 26.1 a
OSBR Risk 1 IN: 5576
Test Results:: NEGATIVE
Weight: 177 [lb_av]

## 2023-11-27 ENCOUNTER — Ambulatory Visit: Payer: Self-pay | Admitting: Certified Nurse Midwife

## 2023-12-19 ENCOUNTER — Ambulatory Visit: Attending: Maternal & Fetal Medicine | Admitting: Maternal & Fetal Medicine

## 2023-12-19 ENCOUNTER — Encounter: Admitting: Obstetrics and Gynecology

## 2023-12-19 ENCOUNTER — Ambulatory Visit (HOSPITAL_BASED_OUTPATIENT_CLINIC_OR_DEPARTMENT_OTHER)

## 2023-12-19 ENCOUNTER — Telehealth: Payer: Self-pay

## 2023-12-19 VITALS — BP 124/87

## 2023-12-19 DIAGNOSIS — O09292 Supervision of pregnancy with other poor reproductive or obstetric history, second trimester: Secondary | ICD-10-CM | POA: Diagnosis not present

## 2023-12-19 DIAGNOSIS — Z363 Encounter for antenatal screening for malformations: Secondary | ICD-10-CM | POA: Diagnosis not present

## 2023-12-19 DIAGNOSIS — Z3480 Encounter for supervision of other normal pregnancy, unspecified trimester: Secondary | ICD-10-CM | POA: Diagnosis not present

## 2023-12-19 DIAGNOSIS — Z3A19 19 weeks gestation of pregnancy: Secondary | ICD-10-CM | POA: Diagnosis not present

## 2023-12-19 DIAGNOSIS — Z2839 Other underimmunization status: Secondary | ICD-10-CM | POA: Diagnosis not present

## 2023-12-19 DIAGNOSIS — O99212 Obesity complicating pregnancy, second trimester: Secondary | ICD-10-CM | POA: Diagnosis present

## 2023-12-19 DIAGNOSIS — O09899 Supervision of other high risk pregnancies, unspecified trimester: Secondary | ICD-10-CM

## 2023-12-19 DIAGNOSIS — O09299 Supervision of pregnancy with other poor reproductive or obstetric history, unspecified trimester: Secondary | ICD-10-CM

## 2023-12-19 NOTE — Progress Notes (Addendum)
 Patient information  Patient Name: Christie Reyes  Patient MRN:   [id number]  Referring practice: MFM Referring Provider: [facility]  Problem List   Patient Active Problem List   Diagnosis Date Noted   Rubella non-immune status, antepartum 10/25/2023   Anxiety    Supervision of other normal pregnancy, antepartum 09/25/2023   History of oligohydramnios in prior pregnancy, currently pregnant 09/25/2023   Migraine 03/12/2012    Maternal Fetal medicine Consult  Christie Reyes is a 28 y.o. G2P1001 at [redacted]w[redacted]d here for ultrasound and consultation. Christie Reyes is doing well today with no acute concerns. She had LR female on NIPT. Today we focused on the following:   Hx of elevated AFP: The patient has a history of an elevated AFP and a previous pregnancy.  This was to be suspected of placental origin since the baby did not have an open neural tube defect or an abdominal wall defect.  She had a negative AFP this pregnancy and the ultrasound appears normal.  No further testing indicated  Hx of low fluid: Patient was told she had low fluid at 38 weeks and was delivered due to this as well as her high AFP.  The AFI was around 8 cm and was not technically oligohydramnios.  Today the amniotic fluid looks normal.  The patient had time to ask questions that were answered to her satisfaction.  She verbalized understanding and agrees to proceed with the plan below.  Sonographic findings Single intrauterine pregnancy at 19w 3d. Fetal cardiac activity:  Observed and appears normal. Presentation: Cephalic. The anatomic structures that were well seen appear normal without evidence of soft markers. The anatomic survey is complete.  Fetal biometry shows the estimated fetal weight at the 29 percentile. Amniotic fluid: Within normal limits.  MVP: 6.14 cm. Placenta: Posterior. Adnexa: No abnormality visualized. Cervical length: 3.2 cm.  There are limitations of prenatal ultrasound  such as the inability to detect certain abnormalities due to poor visualization. Various factors such as fetal position, gestational age and maternal body habitus may increase the difficulty in visualizing the fetal anatomy.    Recommendations -EDD should be 05/11/2024 based on  Early Ultrasound  (09/25/23). -No further ultrasounds are recommended at this time based on the current indications. If future indications arise (e.g. size/date discrepancy on fundal height, gestational diabetes or hypertension) and an ultrasound is to be desired at our MFM office, please send a referral.    Review of Systems: A review of systems was performed and was negative except per HPI   Vitals and Physical Exam    12/19/2023    8:04 AM 11/22/2023    1:49 PM 10/24/2023    1:17 PM  Vitals with BMI  Weight  177 lbs 174 lbs  BMI   30.83  Systolic 124 121 878  Diastolic 87 68 80  Pulse  101 91    Sitting comfortably on the sonogram table Nonlabored breathing Normal rate and rhythm Abdomen is nontender  Past pregnancies OB History  Gravida Para Term Preterm AB Living  2 1 1   1   SAB IAB Ectopic Multiple Live Births      1    # Outcome Date GA Lbr Len/2nd Weight Sex Type Anes PTL Lv  2 Current           1 Term 09/20/21 [redacted]w[redacted]d  6 lb 11.9 oz (3.06 kg) F Vag-Spont EPI  LIV    Obstetric Comments  2023  oligo     I spent 45 minutes reviewing the patients chart, including labs and images as well as counseling the patient about her medical conditions. Greater than 50% of the time was spent in direct face-to-face patient counseling.  Delora Smaller  MFM, [facility]   12/19/2023  9:43 AM

## 2023-12-20 ENCOUNTER — Ambulatory Visit: Payer: Self-pay | Admitting: Family Medicine

## 2023-12-20 DIAGNOSIS — Z3480 Encounter for supervision of other normal pregnancy, unspecified trimester: Secondary | ICD-10-CM

## 2024-01-03 ENCOUNTER — Telehealth: Payer: Self-pay

## 2024-01-03 ENCOUNTER — Encounter: Admitting: Certified Nurse Midwife

## 2024-01-03 DIAGNOSIS — Z3492 Encounter for supervision of normal pregnancy, unspecified, second trimester: Secondary | ICD-10-CM

## 2024-01-03 DIAGNOSIS — Z3A21 21 weeks gestation of pregnancy: Secondary | ICD-10-CM

## 2024-01-03 NOTE — Telephone Encounter (Signed)
 Left message for pt to call office back to get appt for today rescheduled.

## 2024-01-29 ENCOUNTER — Ambulatory Visit: Admitting: Family Medicine

## 2024-01-29 VITALS — BP 119/76 | HR 90 | Wt 187.0 lb

## 2024-01-29 DIAGNOSIS — O09292 Supervision of pregnancy with other poor reproductive or obstetric history, second trimester: Secondary | ICD-10-CM | POA: Diagnosis not present

## 2024-01-29 DIAGNOSIS — Z3A25 25 weeks gestation of pregnancy: Secondary | ICD-10-CM | POA: Diagnosis not present

## 2024-01-29 DIAGNOSIS — O09299 Supervision of pregnancy with other poor reproductive or obstetric history, unspecified trimester: Secondary | ICD-10-CM

## 2024-01-29 DIAGNOSIS — Z3480 Encounter for supervision of other normal pregnancy, unspecified trimester: Secondary | ICD-10-CM

## 2024-01-29 NOTE — Progress Notes (Signed)
   PRENATAL VISIT NOTE  Subjective:  Christie Reyes is a 28 y.o. G2P1001 at [redacted]w[redacted]d being seen today for ongoing prenatal care.  She is currently monitored for the following issues for this low-risk pregnancy and has Migraine; Supervision of other normal pregnancy, antepartum; History of oligohydramnios in prior pregnancy, currently pregnant; Anxiety; and Rubella non-immune status, antepartum on their problem list.  Patient reports no complaints.  Contractions: Irritability. Vag. Bleeding: None.  Movement: Present. Denies leaking of fluid.   The following portions of the patient's history were reviewed and updated as appropriate: allergies, current medications, past family history, past medical history, past social history, past surgical history and problem list.   Objective:    Vitals:   01/29/24 1441 01/29/24 1455  BP: (!) 147/79 119/76  Pulse: (!) 105 90  Weight: 187 lb (84.8 kg)     Fetal Status:  Fetal Heart Rate (bpm): 154 Fundal Height: 25 cm Movement: Present    General: Alert, oriented and cooperative. Patient is in no acute distress.  Skin: Skin is warm and dry. No rash noted.   Cardiovascular: Normal heart rate noted  Respiratory: Normal respiratory effort, no problems with respiration noted  Abdomen: Soft, gravid, appropriate for gestational age.  Pain/Pressure: Present     Pelvic: Cervical exam deferred        Extremities: Normal range of motion.  Edema: None  Mental Status: Normal mood and affect. Normal behavior. Normal judgment and thought content.   Assessment and Plan:  Pregnancy: G2P1001 at [redacted]w[redacted]d 1. History of oligohydramnios in prior pregnancy, currently pregnant AFI was borderline low (8cm), pregnancy with elevated AFP as well.  Patient requesting US  for her peace of mind. We discussed awaiting results of 3rd trimester labs to help  know timing Monitoring FH  2. Supervision of other normal pregnancy, antepartum (Primary) Up to date FH appropriate  today Feeling movement Reviewed next appt and labs Initially elevated BP but resolved on recheck Concerns; None  3. [redacted] weeks gestation of pregnancy   Preterm labor symptoms and general obstetric precautions including but not limited to vaginal bleeding, contractions, leaking of fluid and fetal movement were reviewed in detail with the patient. Please refer to After Visit Summary for other counseling recommendations.   Return in about 4 weeks (around 02/26/2024) for Routine prenatal care, 28 wk labs.  No future appointments.  Suzen Maryan Masters, MD

## 2024-01-29 NOTE — Progress Notes (Signed)
 ROB   CC: Pain and pressure notes pain in bladder and lower back. No dysuria. Pt asked if anymore U/S will be done the remainder of pregnancy.

## 2024-01-31 ENCOUNTER — Encounter: Admitting: Obstetrics and Gynecology

## 2024-02-27 ENCOUNTER — Encounter: Payer: Self-pay | Admitting: Obstetrics and Gynecology

## 2024-02-27 ENCOUNTER — Ambulatory Visit (INDEPENDENT_AMBULATORY_CARE_PROVIDER_SITE_OTHER): Admitting: Obstetrics and Gynecology

## 2024-02-27 ENCOUNTER — Other Ambulatory Visit

## 2024-02-27 VITALS — BP 109/75 | HR 99 | Wt 189.0 lb

## 2024-02-27 DIAGNOSIS — Z3490 Encounter for supervision of normal pregnancy, unspecified, unspecified trimester: Secondary | ICD-10-CM

## 2024-02-27 DIAGNOSIS — Z3480 Encounter for supervision of other normal pregnancy, unspecified trimester: Secondary | ICD-10-CM

## 2024-02-27 DIAGNOSIS — Z3A29 29 weeks gestation of pregnancy: Secondary | ICD-10-CM

## 2024-02-27 DIAGNOSIS — O09293 Supervision of pregnancy with other poor reproductive or obstetric history, third trimester: Secondary | ICD-10-CM

## 2024-02-27 DIAGNOSIS — O09299 Supervision of pregnancy with other poor reproductive or obstetric history, unspecified trimester: Secondary | ICD-10-CM

## 2024-02-27 NOTE — Progress Notes (Signed)
 PRENATAL VISIT NOTE  Subjective:  Christie Reyes is a 28 y.o. G2P1001 at [redacted]w[redacted]d being seen today for ongoing prenatal care.  She is currently monitored for the following issues for this low-risk pregnancy and has Migraine; Supervision of other normal pregnancy, antepartum; History of oligohydramnios in prior pregnancy, currently pregnant; Anxiety; and Rubella non-immune status, antepartum on their problem list.  Patient reports no complaints.  Contractions: Irregular. Vag. Bleeding: None.  Movement: Present. Denies leaking of fluid.   The following portions of the patient's history were reviewed and updated as appropriate: allergies, current medications, past family history, past medical history, past social history, past surgical history and problem list.   Objective:   Vitals:   02/27/24 0828  BP: 109/75  Pulse: 99  Weight: 189 lb (85.7 kg)    Fetal Status:  Fetal Heart Rate (bpm): 130 Fundal Height: 29 cm Movement: Present    General: Alert, oriented and cooperative. Patient is in no acute distress.  Skin: Skin is warm and dry. No rash noted.   Cardiovascular: Normal heart rate noted  Respiratory: Normal respiratory effort, no problems with respiration noted  Abdomen: Soft, gravid, appropriate for gestational age.  Pain/Pressure: Present     Pelvic: Cervical exam deferred        Extremities: Normal range of motion.     Mental Status: Normal mood and affect. Normal behavior. Normal judgment and thought content.      10/24/2023    1:13 PM 09/13/2021   11:02 AM 07/19/2021    1:33 PM  Depression screen PHQ 2/9  Decreased Interest 0 0 0  Down, Depressed, Hopeless 0 0 0  PHQ - 2 Score 0 0 0  Altered sleeping 3 0 1  Tired, decreased energy 3 0 0  Change in appetite 3 0 1  Feeling bad or failure about yourself  0 0 0  Trouble concentrating 0 0 0  Moving slowly or fidgety/restless 0 0 0  Suicidal thoughts 0 0 0  PHQ-9 Score 9  0  2   Difficult doing work/chores Not difficult at  all  Not difficult at all     Data saved with a previous flowsheet row definition        10/24/2023    1:14 PM 09/13/2021   11:03 AM 03/02/2021    2:34 PM 05/24/2017    8:18 AM  GAD 7 : Generalized Anxiety Score  Nervous, Anxious, on Edge 0 0 0 1  Control/stop worrying 3 0 0 2  Worry too much - different things 3 0 0 2  Trouble relaxing 3 0 2 3  Restless 3 0 0 3  Easily annoyed or irritable 0 0 0 3  Afraid - awful might happen 0 0 0 0  Total GAD 7 Score 12 0 2 14  Anxiety Difficulty Not difficult at all   Very difficult    Assessment and Plan:  Pregnancy: G2P1001 at [redacted]w[redacted]d 1. Supervision of other normal pregnancy, antepartum (Primary) Patient is doing well  Third trimester labs and glucola today Patient declines flu and tdap Patient is undecided on contraception and is considering condoms like last time  2. Rubella non-immune status, antepartum Will offer pp  3. History of oligohydramnios in prior pregnancy, currently pregnant   Preterm labor symptoms and general obstetric precautions including but not limited to vaginal bleeding, contractions, leaking of fluid and fetal movement were reviewed in detail with the patient. Please refer to After Visit Summary for other counseling recommendations.  Return in about 2 weeks (around 03/12/2024) for in person, ROB, Low risk.  Future Appointments  Date Time Provider Department Center  02/27/2024  8:55 AM Keneisha Heckart, [place], MD [facility]    Winton Felt, MD

## 2024-02-28 LAB — CBC
Hematocrit: 34.8 % (ref 34.0–46.6)
Hemoglobin: 11.5 g/dL (ref 11.1–15.9)
MCH: 30.9 pg (ref 26.6–33.0)
MCHC: 33 g/dL (ref 31.5–35.7)
MCV: 94 fL (ref 79–97)
Platelets: 255 x10E3/uL (ref 150–450)
RBC: 3.72 x10E6/uL — ABNORMAL LOW (ref 3.77–5.28)
RDW: 12 % (ref 11.7–15.4)
WBC: 8.4 x10E3/uL (ref 3.4–10.8)

## 2024-02-28 LAB — SYPHILIS: RPR W/REFLEX TO RPR TITER AND TREPONEMAL ANTIBODIES, TRADITIONAL SCREENING AND DIAGNOSIS ALGORITHM: RPR Ser Ql: NONREACTIVE

## 2024-02-28 LAB — GLUCOSE TOLERANCE, 2 HOURS W/ 1HR
Glucose, 1 hour: 97 mg/dL (ref 70–179)
Glucose, 2 hour: 97 mg/dL (ref 70–152)
Glucose, Fasting: 132 mg/dL — ABNORMAL HIGH (ref 70–91)

## 2024-02-28 LAB — HIV ANTIBODY (ROUTINE TESTING W REFLEX): HIV Screen 4th Generation wRfx: NONREACTIVE

## 2024-03-02 ENCOUNTER — Ambulatory Visit: Payer: Self-pay | Admitting: Obstetrics and Gynecology

## 2024-03-02 DIAGNOSIS — O24419 Gestational diabetes mellitus in pregnancy, unspecified control: Secondary | ICD-10-CM | POA: Insufficient documentation

## 2024-03-04 ENCOUNTER — Other Ambulatory Visit: Payer: Self-pay | Admitting: *Deleted

## 2024-03-04 MED ORDER — GLUCOSE BLOOD VI STRP: ORAL_STRIP | 12 refills | Status: DC

## 2024-03-04 MED ORDER — ACCU-CHEK GUIDE W/DEVICE KIT: 1.0000 | PACK | Freq: Four times a day (QID) | 0 refills | Status: DC

## 2024-03-04 MED ORDER — ACCU-CHEK SOFTCLIX LANCETS MISC: 1.0000 | Freq: Four times a day (QID) | 12 refills | Status: DC

## 2024-03-07 NOTE — Progress Notes (Deleted)
 Class start Time: ***   Class End Time: ***  This was a class of *** patients.   Patient was seen on *** for Gestational Diabetes self-management class at the [facility]. The following learning objectives were met by the patient during this course:  States the definition of Gestational Diabetes States why dietary management is important in controlling blood glucose Describes the effects each nutrient has on blood glucose levels Demonstrates ability to create a balanced meal plan Demonstrates carbohydrate counting  States when to check blood glucose levels Demonstrates proper blood glucose monitoring techniques States the effect of stress and exercise on blood glucose levels States the importance of limiting caffeine and abstaining from alcohol and smoking  Blood glucose monitor given: *** Lot # *** Exp: *** Blood glucose reading: ***  *** Patient has a meter prior to visit. Patient is *** testing pre breakfast and 2 hours after each meal. FBS: *** Postprandial: *** Blood glucose today in class ***  Patient instructed to monitor glucose levels:  QID FBS: 60 - <95 1 hour: <140 2 hour: <120  *Patient received handouts: Nutrition Diabetes and Pregnancy Carbohydrate Counting List Blood glucose log Snack ideas for diabetes during pregnancy Plate Planner  Patient will be seen for follow-up as needed.

## 2024-03-11 ENCOUNTER — Telehealth: Admitting: Advanced Practice Midwife

## 2024-03-11 ENCOUNTER — Encounter: Payer: Self-pay | Admitting: Advanced Practice Midwife

## 2024-03-11 DIAGNOSIS — O24419 Gestational diabetes mellitus in pregnancy, unspecified control: Secondary | ICD-10-CM

## 2024-03-11 DIAGNOSIS — Z3490 Encounter for supervision of normal pregnancy, unspecified, unspecified trimester: Secondary | ICD-10-CM

## 2024-03-11 DIAGNOSIS — Z3A31 31 weeks gestation of pregnancy: Secondary | ICD-10-CM

## 2024-03-11 DIAGNOSIS — Z3480 Encounter for supervision of other normal pregnancy, unspecified trimester: Secondary | ICD-10-CM

## 2024-03-11 NOTE — Progress Notes (Signed)
 I connected with Christie Reyes 03/18/2024 at  2:30 PM EST by: MyChart video and verified that I am speaking with the correct person using two identifiers.  Patient is located at home and provider is located at private.     I discussed the limitations, risks, security and privacy concerns of performing an evaluation and management service by MyChart video and the availability of in person appointments. I also discussed with the patient that there may be a patient responsible charge related to this service. By engaging in this virtual visit, you consent to the provision of healthcare.  Additionally, you authorize for your insurance to be billed for the services provided during this visit.  The patient expressed understanding and agreed to proceed.  The following staff members participated in the virtual visit: None    PRENATAL VISIT NOTE  Subjective:  Christie Reyes is a 28 y.o. G2P1001 at [redacted]w[redacted]d  for virtual visit for ongoing prenatal care.  She is currently monitored for the following issues for this high-risk pregnancy and has Migraine; Supervision of other normal pregnancy, antepartum; History of oligohydramnios in prior pregnancy, currently pregnant; Anxiety; Rubella non-immune status, antepartum; and Gestational diabetes mellitus (GDM) in third trimester on their problem list.  Patient reports no complaints.   .  .   . Denies leaking of fluid.   The following portions of the patient's history were reviewed and updated as appropriate: allergies, current medications, past family history, past medical history, past social history, past surgical history and problem list.   Objective:  There were no vitals filed for this visit. Self-Obtained  Fetal Status:           Fasting CBGs: 84-90 (1 out of range) PCB/L/D:  100, 123, 135, 170 (most out of range)   Assessment and Plan:  Pregnancy: G2P1001 at [redacted]w[redacted]d 1. Gestational diabetes mellitus (GDM) in third trimester, gestational diabetes method of  control unspecified (Primary) - New Dx GDM. Diabetes education scheduled. Started checking blood sugars. Mildly out of range. Discussed lifestyle changes and encouraged logging meals. May need Medicine.  - Antenatal testing per MFM  2. Supervision of other normal pregnancy, antepartum   3. Rubella non-immune status, antepartum - MMR PP  4. [redacted] weeks gestation of pregnancy   Preterm labor symptoms and general obstetric precautions including but not limited to vaginal bleeding, contractions, leaking of fluid and fetal movement were reviewed in detail with the patient.  No follow-ups on file.  Future Appointments  Date Time Provider Department Center  03/25/2024  2:30 PM Izell Harari, MD [facility]  04/08/2024  2:30 PM Izell Harari, MD [facility]  04/22/2024  2:30 PM Izell Harari, MD [facility]     Quentin Strebel  Claudene, CNM

## 2024-03-13 ENCOUNTER — Encounter

## 2024-03-18 ENCOUNTER — Other Ambulatory Visit: Payer: Self-pay | Admitting: Advanced Practice Midwife

## 2024-03-18 DIAGNOSIS — O24410 Gestational diabetes mellitus in pregnancy, diet controlled: Secondary | ICD-10-CM

## 2024-03-18 DIAGNOSIS — Z3A32 32 weeks gestation of pregnancy: Secondary | ICD-10-CM

## 2024-03-25 ENCOUNTER — Other Ambulatory Visit

## 2024-03-25 ENCOUNTER — Ambulatory Visit

## 2024-03-25 ENCOUNTER — Other Ambulatory Visit: Payer: Self-pay

## 2024-03-25 ENCOUNTER — Encounter: Admitting: Obstetrics and Gynecology

## 2024-03-25 VITALS — BP 111/75 | HR 89

## 2024-03-25 DIAGNOSIS — O09299 Supervision of pregnancy with other poor reproductive or obstetric history, unspecified trimester: Secondary | ICD-10-CM

## 2024-03-25 DIAGNOSIS — Z2839 Other underimmunization status: Secondary | ICD-10-CM

## 2024-03-25 DIAGNOSIS — O24410 Gestational diabetes mellitus in pregnancy, diet controlled: Secondary | ICD-10-CM

## 2024-03-25 DIAGNOSIS — Z3A32 32 weeks gestation of pregnancy: Secondary | ICD-10-CM

## 2024-03-25 DIAGNOSIS — Z3480 Encounter for supervision of other normal pregnancy, unspecified trimester: Secondary | ICD-10-CM

## 2024-03-29 ENCOUNTER — Inpatient Hospital Stay (HOSPITAL_COMMUNITY)
Admission: AD | Admit: 2024-03-29 | Discharge: 2024-03-29 | Discharge status: Against Medical Advice | Attending: Obstetrics and Gynecology | Admitting: Obstetrics and Gynecology

## 2024-03-29 DIAGNOSIS — Z5321 Procedure and treatment not carried out due to patient leaving prior to being seen by health care provider: Secondary | ICD-10-CM | POA: Diagnosis not present

## 2024-03-29 DIAGNOSIS — O26899 Other specified pregnancy related conditions, unspecified trimester: Secondary | ICD-10-CM | POA: Diagnosis present

## 2024-03-29 DIAGNOSIS — Z3A00 Weeks of gestation of pregnancy not specified: Secondary | ICD-10-CM | POA: Diagnosis not present

## 2024-03-29 NOTE — MAU Note (Signed)
Not in lobby

## 2024-03-31 ENCOUNTER — Encounter (HOSPITAL_COMMUNITY): Payer: Self-pay | Admitting: Obstetrics & Gynecology

## 2024-03-31 ENCOUNTER — Other Ambulatory Visit: Payer: Self-pay

## 2024-03-31 ENCOUNTER — Inpatient Hospital Stay (HOSPITAL_COMMUNITY)
Admission: AD | Admit: 2024-03-31 | Discharge: 2024-03-31 | Disposition: A | Discharge status: Home / Self Care | Attending: Obstetrics & Gynecology | Admitting: Obstetrics & Gynecology

## 2024-03-31 DIAGNOSIS — Z3689 Encounter for other specified antenatal screening: Secondary | ICD-10-CM

## 2024-03-31 DIAGNOSIS — Z2839 Other underimmunization status: Secondary | ICD-10-CM | POA: Insufficient documentation

## 2024-03-31 DIAGNOSIS — O99283 Endocrine, nutritional and metabolic diseases complicating pregnancy, third trimester: Secondary | ICD-10-CM | POA: Insufficient documentation

## 2024-03-31 DIAGNOSIS — Z3A34 34 weeks gestation of pregnancy: Secondary | ICD-10-CM

## 2024-03-31 DIAGNOSIS — F419 Anxiety disorder, unspecified: Secondary | ICD-10-CM | POA: Insufficient documentation

## 2024-03-31 DIAGNOSIS — O98513 Other viral diseases complicating pregnancy, third trimester: Secondary | ICD-10-CM | POA: Insufficient documentation

## 2024-03-31 DIAGNOSIS — Z79899 Other long term (current) drug therapy: Secondary | ICD-10-CM | POA: Insufficient documentation

## 2024-03-31 DIAGNOSIS — O24410 Gestational diabetes mellitus in pregnancy, diet controlled: Secondary | ICD-10-CM | POA: Diagnosis not present

## 2024-03-31 DIAGNOSIS — O4703 False labor before 37 completed weeks of gestation, third trimester: Secondary | ICD-10-CM | POA: Diagnosis not present

## 2024-03-31 DIAGNOSIS — O09293 Supervision of pregnancy with other poor reproductive or obstetric history, third trimester: Secondary | ICD-10-CM | POA: Diagnosis not present

## 2024-03-31 DIAGNOSIS — O99343 Other mental disorders complicating pregnancy, third trimester: Secondary | ICD-10-CM | POA: Diagnosis not present

## 2024-03-31 DIAGNOSIS — Z3493 Encounter for supervision of normal pregnancy, unspecified, third trimester: Secondary | ICD-10-CM

## 2024-03-31 DIAGNOSIS — R109 Unspecified abdominal pain: Secondary | ICD-10-CM | POA: Insufficient documentation

## 2024-03-31 LAB — URINALYSIS, ROUTINE W REFLEX MICROSCOPIC
Bacteria, UA: NONE SEEN
Glucose, UA: NEGATIVE mg/dL
Ketones, ur: NEGATIVE mg/dL
Nitrite: NEGATIVE
Protein, ur: 30 mg/dL — AB
Specific Gravity, Urine: 1.032 — ABNORMAL HIGH (ref 1.005–1.030)
pH: 5 (ref 5.0–8.0)

## 2024-03-31 LAB — WET PREP, GENITAL
Clue Cells Wet Prep HPF POC: NONE SEEN
Sperm: NONE SEEN
Trich, Wet Prep: NONE SEEN
WBC, Wet Prep HPF POC: >=10 — AB
Yeast Wet Prep HPF POC: NONE SEEN

## 2024-03-31 NOTE — MAU Provider Note (Signed)
 Chief Complaint:  Abdominal Pain   HPI   None     Christie Reyes is a 28 y.o. G2P1001 at [redacted]w[redacted]d who presents to maternity admissions reporting intermittent abdominal pain, increased pelvic pressure and bloody mucus starting today. She states that the abdominal pain feels like contractions. She has needed to sit with her legs further open than normal because of the pelvic pressure. She also notes that the baby hasn't been moving as much over the past few days. She denies leakage of fluid at this time.    Pregnancy Course: Receives prenatal care at [facility]. Pregnancy is complicated by GDMA1, rubella non-immune, and history of oligohydramnios in previous pregnancy.  Past Medical History:  Diagnosis Date   Abnormal antenatal AFP screen 08/02/2021   Acute pyelonephritis 01/16/2019   Anxiety    ASCUS with positive high risk HPV cervical 11/20/2019   Depression    Herpes zoster without complication 03/17/2021   Migraine    PCOS (polycystic ovarian syndrome)    Renal stones    OB History  Gravida Para Term Preterm AB Living  2 1 1   1   SAB IAB Ectopic Multiple Live Births      1    # Outcome Date GA Lbr Len/2nd Weight Sex Type Anes PTL Lv  2 Current           1 Term 09/20/21 [redacted]w[redacted]d  3060 g F Vag-Spont EPI  LIV    Obstetric Comments  2023 oligo   Past Surgical History:  Procedure Laterality Date   adnoidectomy and tonsilectomy     TONSILLECTOMY AND ADENOIDECTOMY     WISDOM TOOTH EXTRACTION     Family History  Problem Relation Age of Onset   Diabetes Mother    Hypertension Mother    Cancer Father        brain   Diabetes Father    Diabetes Paternal Grandmother    Social History[1] Allergies[2] Medications Prior to Admission  Medication Sig Dispense Refill Last Dose/Taking   Accu-Chek Softclix Lancets lancets 1 each by Other route 4 (four) times daily. 100 each 12 03/31/2024   acetaminophen  (TYLENOL ) 500 MG tablet Take 1,000 mg by mouth every 6 (six) hours as needed for  moderate pain (pain score 4-6) or mild pain (pain score 1-3).   03/31/2024 at  8:00 AM   Blood Glucose Monitoring Suppl (ACCU-CHEK GUIDE) w/Device KIT 1 Device by Does not apply route 4 (four) times daily. 1 kit 0 03/31/2024   glucose blood test strip Use four times a day 100 each 12 03/31/2024   Prenatal Vit-Fe Fumarate-FA (PREPLUS) 27-1 MG TABS Take 1 tablet by mouth daily. 30 tablet 13 03/31/2024   acetaminophen -caffeine  (EXCEDRIN TENSION HEADACHE) 500-65 MG TABS per tablet Take 2 tablets by mouth every 6 (six) hours as needed. (Patient not taking: No sig reported) 60 tablet 0 Unknown   busPIRone  (BUSPAR ) 7.5 MG tablet Take 1 tablet (7.5 mg total) by mouth 2 (two) times daily. (Patient not taking: No sig reported) 90 tablet 3 Unknown   cyclobenzaprine  (FLEXERIL ) 10 MG tablet Take 1 tablet (10 mg total) by mouth every 8 (eight) hours as needed for muscle spasms. (Patient not taking: No sig reported) 30 tablet 1 Unknown   EPINEPHrine (EPIPEN JR 2-PAK) 0.15 MG/0.3ML injection Inject into the muscle. (Patient not taking: No sig reported)   Unknown   famotidine  (PEPCID ) 20 MG tablet Take 1 tablet (20 mg total) by mouth 2 (two) times daily. (Patient not taking:  No sig reported) 60 tablet 0 Unknown   hydrOXYzine  (ATARAX ) 10 MG tablet Take 1 tablet (10 mg total) by mouth 3 (three) times daily as needed. (Patient not taking: No sig reported) 30 tablet 0 Unknown   miconazole  (MICONAZOLE  3) 200 MG vaginal suppository Place 1 suppository (200 mg total) vaginally at bedtime. (Patient not taking: No sig reported) 3 suppository 0 Unknown   ondansetron  (ZOFRAN -ODT) 4 MG disintegrating tablet Take 1 tablet (4 mg total) by mouth every 8 (eight) hours as needed. (Patient not taking: No sig reported) 30 tablet 0 Unknown    I have reviewed patient's Past Medical Hx, Surgical Hx, Family Hx, Social Hx, medications and allergies.   ROS  Pertinent items noted in HPI and remainder of comprehensive ROS otherwise negative.    PHYSICAL EXAM  Patient Vitals for the past 24 hrs:  BP Temp Temp src Pulse Resp SpO2  03/31/24 1423 109/67 -- -- 86 -- --  03/31/24 1415 -- -- -- -- -- 100 %  03/31/24 1414 108/61 98.2 F (36.8 C) Oral (!) 102 16 --    Constitutional: Well-developed, well-nourished female in no acute distress.  Cardiovascular: normal rate & rhythm, warm and well-perfused Respiratory: normal effort, no problems with respiration noted GI: Abd soft, non-tender, non-distended MS: Extremities nontender, no edema, normal ROM Neurologic: Alert and oriented x 4.  GU: no CVA tenderness Pelvic: normal external female genitalia, physiologic discharge, no blood, cervix clean.   Dilation: 1 Effacement (%): Thick Station: Ballotable Exam by:: Jomarie, MD  Fetal Tracing: Baseline: 130 bpm Variability: Moderate Accelerations: Present Decelerations: Absent Toco: Flat   Labs: Results for orders placed or performed during the hospital encounter of 03/31/24 (from the past 24 hours)  Urinalysis, Routine w reflex microscopic -Urine, Clean Catch     Status: Abnormal   Collection Time: 03/31/24  2:11 PM  Result Value Ref Range   Color, Urine AMBER (A) YELLOW   APPearance CLOUDY (A) CLEAR   Specific Gravity, Urine 1.032 (H) 1.005 - 1.030   pH 5.0 5.0 - 8.0   Glucose, UA NEGATIVE NEGATIVE mg/dL   Hgb urine dipstick SMALL (A) NEGATIVE   Bilirubin Urine SMALL (A) NEGATIVE   Ketones, ur NEGATIVE NEGATIVE mg/dL   Protein, ur 30 (A) NEGATIVE mg/dL   Nitrite NEGATIVE NEGATIVE   Leukocytes,Ua SMALL (A) NEGATIVE   RBC / HPF 21-50 0 - 5 RBC/hpf   WBC, UA 0-5 0 - 5 WBC/hpf   Bacteria, UA NONE SEEN NONE SEEN   Squamous Epithelial / HPF 6-10 0 - 5 /HPF   Mucus PRESENT   Wet prep, genital     Status: Abnormal   Collection Time: 03/31/24  3:14 PM   Specimen: Vaginal  Result Value Ref Range   Yeast Wet Prep HPF POC NONE SEEN NONE SEEN   Trich, Wet Prep NONE SEEN NONE SEEN   Clue Cells Wet Prep HPF POC NONE SEEN  NONE SEEN   WBC, Wet Prep HPF POC >=10 (A) <10   Sperm NONE SEEN     Imaging:  No results found.  MDM & MAU COURSE  MDM: Moderate  MAU Course: Orders Placed This Encounter  Procedures   Wet prep, genital   Urinalysis, Routine w reflex microscopic -Urine, Clean Catch   Contraction - monitoring   External fetal heart monitoring   Vaginal exam   POCT fern test   Discharge patient   No orders of the defined types were placed in this encounter.  VSS. Exam  unremarkable. White discharge seen on speculum exam, swabs collected. SVE 1/thick/ballotable. Plan for recheck in one hour. 4:05p - Repeat SVE negative. Wet prep and UA negative. Advised PO hydration. Counseled on return precautions.  ASSESSMENT   1. [redacted] weeks gestation of pregnancy   2. False labor before 37 completed weeks of gestation in third trimester   3. NST (non-stress test) reactive   4. Movement of fetus present during pregnancy in third trimester     PLAN  Discharge home in stable condition with return precautions.  Follow up with OB as scheduled.    Allergies as of 03/31/2024       Reactions   Cefdinir Anaphylaxis, Shortness Of Breath   Egg Protein-containing Drug Products Anaphylaxis   Elemental Sulfur Shortness Of Breath, Swelling   Other Anaphylaxis, Cough, Rash   Dustmites, cats   Sulfa Antibiotics Anaphylaxis, Swelling   Cat Dander    Influenza Vac Typ  [influenza Virus Vaccine]    Allergy to eggs   Sulfamethoxazole Swelling        Medication List     STOP taking these medications    cyclobenzaprine  10 MG tablet Commonly known as: FLEXERIL    Miconazole  3 200 MG vaginal suppository Generic drug: miconazole        TAKE these medications    Accu-Chek Guide w/Device Kit 1 Device by Does not apply route 4 (four) times daily.   Accu-Chek Softclix Lancets lancets 1 each by Other route 4 (four) times daily.   acetaminophen  500 MG tablet Commonly known as: TYLENOL  Take 1,000 mg by  mouth every 6 (six) hours as needed for moderate pain (pain score 4-6) or mild pain (pain score 1-3).   acetaminophen -caffeine  500-65 MG Tabs per tablet Commonly known as: EXCEDRIN TENSION HEADACHE Take 2 tablets by mouth every 6 (six) hours as needed.   busPIRone  7.5 MG tablet Commonly known as: BUSPAR  Take 1 tablet (7.5 mg total) by mouth 2 (two) times daily.   EpiPen Jr 2-Pak 0.15 MG/0.3ML injection Generic drug: EPINEPHrine Inject into the muscle.   famotidine  20 MG tablet Commonly known as: Pepcid  Take 1 tablet (20 mg total) by mouth 2 (two) times daily.   glucose blood test strip Use four times a day   hydrOXYzine  10 MG tablet Commonly known as: ATARAX  Take 1 tablet (10 mg total) by mouth 3 (three) times daily as needed.   ondansetron  4 MG disintegrating tablet Commonly known as: ZOFRAN -ODT Take 1 tablet (4 mg total) by mouth every 8 (eight) hours as needed.   PrePLUS 27-1 MG Tabs Take 1 tablet by mouth daily.        Charlie Courts, MD  Family Medicine - Obstetrics Fellow        [1]  Social History Tobacco Use   Smoking status: Never   Smokeless tobacco: Never  Vaping Use   Vaping status: Never Used  Substance Use Topics   Alcohol use: Not Currently   Drug use: Not Currently    Types: Marijuana    Comment: Last smoked jan 2023  [2]  Allergies Allergen Reactions   Cefdinir Anaphylaxis and Shortness Of Breath   Egg Protein-Containing Drug Products Anaphylaxis   Elemental Sulfur Shortness Of Breath and Swelling   Other Anaphylaxis, Cough and Rash    Dustmites, cats   Sulfa Antibiotics Anaphylaxis and Swelling   Cat Dander    Influenza Vac Typ  [Influenza Virus Vaccine]     Allergy to eggs   Sulfamethoxazole Swelling

## 2024-03-31 NOTE — Discharge Instructions (Signed)
 You came into the hospital because you were having contractions and not feeling the baby move much. We did swabs for infections which were negative. We also did cervical checks which didn't change and didn't see contractions close together on the monitor. You are not in labor at this time. Please follow up with your OB. Make sure you are drinking lots of water - your urine should be clear to pale yellow in color. I hope you have a happyu and safe holiday!

## 2024-03-31 NOTE — MAU Note (Signed)
 Christie Reyes is a 28 y.o. at [redacted]w[redacted]d here in MAU reporting: intermittent abd pain and some bloody mucous when she wipes after using the bathroom. Reports  decreased FM as well for the past few days. No c/o LOF  LMP:  Onset of complaint: few days ago Pain score: 9/10  FHT: pt by-passed triage and placed straight in MAU exam room   Lab orders placed from triage: UA and labor eval

## 2024-04-01 LAB — GC/CHLAMYDIA PROBE AMP (~~LOC~~) NOT AT ARMC
Chlamydia: NEGATIVE
Comment: NEGATIVE
Comment: NORMAL
Neisseria Gonorrhea: NEGATIVE

## 2024-04-04 NOTE — L&D Delivery Note (Addendum)
 OB/GYN Faculty Practice Delivery Note  Christie Reyes is a 29 y.o. G2P1001 s/p NSVD at [redacted]w[redacted]d. She was admitted for SOL.   ROM: 0h 75m with clear fluid GBS Status: negative Maximum Maternal Temperature: 98.5  Labor Progress: SOL SROM at 0918  Delivery Date/Time: 05/03/24 @0942  Delivery: Called to room and patient was complete and pushing. Head delivered OA. Nuchal cord present x1. Shoulder and body delivered in usual fashion. Infant with spontaneous cry, placed on mother's abdomen, dried and stimulated. Cord clamped x 2 after 1-minute delay, and cut by FOB. Cord blood drawn. Placenta delivered spontaneously, intact, with 3-vessel cord. Fundus firm with massage and Pitocin . Labia, perineum, vagina, and cervix inspected, 1st degree laceration found, not repaired due to approximation .   Placenta: Intact Complications: None Lacerations: 1st degree EBL: 25 Analgesia: none  Postpartum Planning [ x] transfer orders to MB [ x] discharge summary started & shared [ x] message to sent to schedule follow-up  [x ] lists updated  Infant: Christie Reyes  APGARs 9/9  pending  Christie Reyes, CNM,  Center for [employer], [facility] 05/03/2024, 10:01 AM

## 2024-04-05 ENCOUNTER — Other Ambulatory Visit: Payer: Self-pay | Admitting: Advanced Practice Midwife

## 2024-04-05 DIAGNOSIS — Z8669 Personal history of other diseases of the nervous system and sense organs: Secondary | ICD-10-CM

## 2024-04-05 MED ORDER — BUTALBITAL-APAP-CAFFEINE 50-325-40 MG PO CAPS: 1.0000 | ORAL_CAPSULE | Freq: Four times a day (QID) | ORAL | 1 refills | Status: DC | PRN

## 2024-04-08 ENCOUNTER — Encounter: Admitting: Obstetrics and Gynecology

## 2024-04-08 VITALS — BP 126/83 | HR 94 | Wt 192.0 lb

## 2024-04-08 DIAGNOSIS — O26893 Other specified pregnancy related conditions, third trimester: Secondary | ICD-10-CM

## 2024-04-08 DIAGNOSIS — R519 Headache, unspecified: Secondary | ICD-10-CM | POA: Diagnosis not present

## 2024-04-08 DIAGNOSIS — Z3A35 35 weeks gestation of pregnancy: Secondary | ICD-10-CM

## 2024-04-08 DIAGNOSIS — O24419 Gestational diabetes mellitus in pregnancy, unspecified control: Secondary | ICD-10-CM | POA: Diagnosis not present

## 2024-04-08 MED ORDER — METOCLOPRAMIDE HCL 10 MG PO TABS: 10.0000 mg | ORAL_TABLET | Freq: Four times a day (QID) | ORAL | 0 refills | Status: DC | PRN

## 2024-04-08 NOTE — Progress Notes (Signed)
 Pt complains of prolonged HA, since 12/22.  Pt states Rx made HA worse.  Pt is taking Tylenol  at home with little relief.

## 2024-04-08 NOTE — Progress Notes (Signed)
 "  PRENATAL VISIT NOTE  Subjective:  Christie Reyes is a 29 y.o. G2P1001 at [redacted]w[redacted]d being seen today for ongoing prenatal care.  She is currently monitored for the following issues for this high-risk pregnancy and has Migraine; Supervision of other normal pregnancy, antepartum; History of oligohydramnios in prior pregnancy, currently pregnant; Anxiety; Rubella non-immune status, antepartum; and Gestational diabetes mellitus (GDM) in third trimester on their problem list.  Patient reports persistent HA since 12/22.  Contractions: Irregular. Vag. Bleeding: Scant.  Movement: Present. Denies leaking of fluid.   The following portions of the patient's history were reviewed and updated as appropriate: allergies, current medications, past family history, past medical history, past social history, past surgical history and problem list.   Objective:   Vitals:   04/08/24 1455  BP: 126/83  Pulse: 94  Weight: 192 lb (87.1 kg)    Fetal Status:  Fetal Heart Rate (bpm): 130   Movement: Present    General: Alert, oriented and cooperative. Patient is in no acute distress.  Skin: Skin is warm and dry. No rash noted.   Cardiovascular: Normal heart rate noted  Respiratory: Normal respiratory effort, no problems with respiration noted  Abdomen: Soft, gravid, appropriate for gestational age.  Pain/Pressure: Present     Pelvic: Cervical exam deferred        Extremities: Normal range of motion.     Mental Status: Normal mood and affect. Normal behavior. Normal judgment and thought content.   Neuro: CN 2-12 grossly intact, eomi, perrl, normal gait, sensation and strength normal diffusely     10/24/2023    1:13 PM 09/13/2021   11:02 AM 07/19/2021    1:33 PM  Depression screen PHQ 2/9  Decreased Interest 0 0 0  Down, Depressed, Hopeless 0 0 0  PHQ - 2 Score 0 0 0  Altered sleeping 3 0 1  Tired, decreased energy 3 0 0  Change in appetite 3 0 1  Feeling bad or failure about yourself  0 0 0  Trouble  concentrating 0 0 0  Moving slowly or fidgety/restless 0 0 0  Suicidal thoughts 0 0 0  PHQ-9 Score 9  0  2   Difficult doing work/chores Not difficult at all  Not difficult at all     Data saved with a previous flowsheet row definition        10/24/2023    1:14 PM 09/13/2021   11:03 AM 03/02/2021    2:34 PM 05/24/2017    8:18 AM  GAD 7 : Generalized Anxiety Score  Nervous, Anxious, on Edge 0 0 0 1  Control/stop worrying 3 0 0 2  Worry too much - different things 3 0 0 2  Trouble relaxing 3 0 2 3  Restless 3 0 0 3  Easily annoyed or irritable 0 0 0 3  Afraid - awful might happen 0 0 0 0  Total GAD 7 Score 12 0 2 14  Anxiety Difficulty Not difficult at all   Very difficult    Assessment and Plan:  Pregnancy: G2P1001 at [redacted]w[redacted]d 1. Pregnancy headache in third trimester (Primary) Patient states has h/o HAs outside of pregnancy and was on nortriptyline for this in the past.  Pt picked up fioricet a few days ago and has used one per day for the past two days but it makes it worse. I d/w her to stop it and will switch to PRN reglan . Pt not checking sugars due to HAs. I told her that will  try reglan  and try and get asap outpatient imaging but if gets worse to go to MAU; she states she went there on 12/28 but they didn't do anything. No mention of HA and it appears to have been just a labor eval.  Recommend she have adequate hydration and 7-8 hours asleep - MR ANGIO HEAD WO CONTRAST; Future - MR MRV HEAD WO CM; Future  2. [redacted] weeks gestation of pregnancy GBS next visit  3. Gestational diabetes mellitus (GDM) in third trimester, gestational diabetes method of control unspecified Importance of checking sugars d/w her.  12/22: efw 18%, 1966g, ac 25%, afi 13.5  Preterm labor symptoms and general obstetric precautions including but not limited to vaginal bleeding, contractions, leaking of fluid and fetal movement were reviewed in detail with the patient. Please refer to After Visit Summary for  other counseling recommendations.   Return in about 1 week (around 04/15/2024) for in person, md or app, high risk ob.  Future Appointments  Date Time Provider Department Center  04/22/2024  9:00 AM [facility]  1 [facility] [place]  04/22/2024  2:30 PM Izell Harari, MD [facility]    Harari Izell, MD  "

## 2024-04-08 NOTE — Patient Instructions (Signed)
   Safe Medications in Pregnancy  that are over the counter  Acne:  Benzoyl Peroxide  Salicylic Acid  *NO-Retin A  Backache/Headache:  Tylenol: 2 regular strength every 4 hours OR               2 Extra strength every 6 hours   Colds/Coughs/Allergies: Allegra Benadryl (alcohol free) 25 mg every 6 hours as needed  Breath right strips  Claritin  Cepacol throat lozenges  Chloraseptic throat spray  Cold-Eeze- up to three times per day  Cough drops, alcohol free  Flonase Guaifenesin  Mucinex (plain/DM) Nasacort Robitussin DM (plain only, alcohol free)  Saline nasal spray/drops Steroid nasal sprays Sudafed (pseudoephedrine) & Actifed * use only after [redacted] weeks gestation and if you do not have high blood pressure  Tylenol  Vicks Vaporub  Zicam Zinc lozenges  Zyrtec   -Make sure to not take anything that has the active ingredient Phenylephrine   Constipation:  Colace  Ducolax suppositories  Fleet enema  Glycerin suppositories  Metamucil  Milk of magnesia  Miralax  Senokot  Smooth move tea   Diarrhea:  Kaopectate  Imodium A-D   *NO Pepto Bismol   Hemorrhoids:  Anusol  Anusol HC  Preparation H  Tucks   Indigestion:  Tums  Maalox  Mylanta  Nexium Pepcid  Zantac Prevacid Protonix Prilosec   Insomnia:  Benadryl (alcohol free) 25mg  every 6 hours as needed  Tylenol PM  Unisom  Leg Cramps:  Tums  Magnesium tablets  Nausea/Vomiting:  Bonine  Dramamine  Emetrol  Ginger extract  Sea bands  Meclizine   Nausea medication to take during pregnancy:  Unisom (doxylamine succinate 25 mg tablets) Take one tablet daily at bedtime. If symptoms are not adequately controlled, the dose can be increased to a maximum recommended dose of two tablets daily (1/2 tablet in the morning, 1/2 tablet mid-afternoon and one at bedtime).  Vitamin B6 100mg  tablets. Take one tablet twice a day (up to 200 mg per day).   Skin Rashes:  Aveeno products  Benadryl cream or  Benadryl tablets 25mg  every 6 hours as needed  Calamine Lotion  1% Hydrocortisone cream   Yeast infection:  Gyne-lotrimin 7  Monistat 3 or 7day    **If taking multiple medications, please check labels to avoid duplicating the same active ingredients  **take medication as directed on the label ** ** Do not exceed 4000 mg of Tylenol in 24 hours**  **Do not take medications that contain Aspirin or Ibuprofen** Unless your doctor has prescribed low dose Aspirin for prevention of  Pre-eclampsia

## 2024-04-15 ENCOUNTER — Encounter: Admitting: Obstetrics & Gynecology

## 2024-04-22 ENCOUNTER — Other Ambulatory Visit: Payer: Self-pay

## 2024-04-22 ENCOUNTER — Ambulatory Visit: Admitting: Obstetrics

## 2024-04-22 ENCOUNTER — Ambulatory Visit: Admitting: Obstetrics and Gynecology

## 2024-04-22 ENCOUNTER — Ambulatory Visit

## 2024-04-22 ENCOUNTER — Encounter: Payer: Self-pay | Admitting: Obstetrics and Gynecology

## 2024-04-22 VITALS — BP 124/83 | HR 103 | Wt 200.0 lb

## 2024-04-22 VITALS — BP 131/74 | HR 93

## 2024-04-22 DIAGNOSIS — Z3480 Encounter for supervision of other normal pregnancy, unspecified trimester: Secondary | ICD-10-CM

## 2024-04-22 DIAGNOSIS — O26893 Other specified pregnancy related conditions, third trimester: Secondary | ICD-10-CM | POA: Diagnosis not present

## 2024-04-22 DIAGNOSIS — Z3A37 37 weeks gestation of pregnancy: Secondary | ICD-10-CM

## 2024-04-22 DIAGNOSIS — O24410 Gestational diabetes mellitus in pregnancy, diet controlled: Secondary | ICD-10-CM

## 2024-04-22 DIAGNOSIS — O09299 Supervision of pregnancy with other poor reproductive or obstetric history, unspecified trimester: Secondary | ICD-10-CM

## 2024-04-22 DIAGNOSIS — O24419 Gestational diabetes mellitus in pregnancy, unspecified control: Secondary | ICD-10-CM

## 2024-04-22 DIAGNOSIS — R519 Headache, unspecified: Secondary | ICD-10-CM | POA: Diagnosis not present

## 2024-04-22 DIAGNOSIS — Z3490 Encounter for supervision of normal pregnancy, unspecified, unspecified trimester: Secondary | ICD-10-CM

## 2024-04-22 NOTE — Progress Notes (Signed)
 Patient reports that she checks blood glucose at home and readings are as follows: Fasting levels range from 95 to 100, and two hour post-meal reading ranges from 115.to 125.

## 2024-04-22 NOTE — Progress Notes (Signed)
 2/2 MN IOL

## 2024-04-22 NOTE — Progress Notes (Signed)
 MFM Consult Note  Christie Reyes is currently at [redacted]w[redacted]d. She has been followed due to diet-controlled gestational diabetes.    She denies any problems since her last exam and reports that her fingerstick values have mostly been within normal limits.   Sonographic findings Single intrauterine pregnancy at 37w 2d.  Fetal cardiac activity:  Observed and appears normal. Presentation: Cephalic. Fetal biometry shows the estimated fetal weight of 5 lb 14 oz,  2666g (14%). Amniotic fluid volume: Within normal limits. AFI: 11.83cm.  MVP: 4.99 cm. Placenta: Posterior.  Due to gestational diabetes, delivery is recommended at around 39 weeks (end of next week).  The patient will discuss scheduling an induction with you during her prenatal visit later today.    No further exams were scheduled in our office.  The patient stated that all of her questions were answered.   A total of 20 minutes was spent counseling and coordinating the care for this patient.  Greater than 50% of the time was spent in direct face-to-face contact.

## 2024-04-22 NOTE — Progress Notes (Signed)
 "  PRENATAL VISIT NOTE  Subjective:  Christie Reyes is a 29 y.o. G2P1001 at [redacted]w[redacted]d being seen today for ongoing prenatal care.  She is currently monitored for the following issues for this low-risk pregnancy and has Migraine; Supervision of other normal pregnancy, antepartum; History of oligohydramnios in prior pregnancy, currently pregnant; Anxiety; Rubella non-immune status, antepartum; and Gestational diabetes mellitus (GDM) in third trimester on their problem list.  Patient reports no complaints.  Contractions: Irregular. Vag. Bleeding: None.  Movement: Present. Denies leaking of fluid.   The following portions of the patient's history were reviewed and updated as appropriate: allergies, current medications, past family history, past medical history, past social history, past surgical history and problem list.   Objective:   Vitals:   04/22/24 1451  BP: 124/83  Pulse: (!) 103  Weight: 200 lb (90.7 kg)    Fetal Status:  Fetal Heart Rate (bpm): 140s   Movement: Present Presentation: Vertex  General: Alert, oriented and cooperative. Patient is in no acute distress.  Skin: Skin is warm and dry. No rash noted.   Cardiovascular: Normal heart rate noted  Respiratory: Normal respiratory effort, no problems with respiration noted  Abdomen: Soft, gravid, appropriate for gestational age.  Pain/Pressure: Present     Pelvic: Cervical exam deferred Dilation: 1.5 Effacement (%): 50 Station: -4 (Ballotable)  Extremities: Normal range of motion.     Mental Status: Normal mood and affect. Normal behavior. Normal judgment and thought content.      10/24/2023    1:13 PM 09/13/2021   11:02 AM 07/19/2021    1:33 PM  Depression screen PHQ 2/9  Decreased Interest 0 0 0  Down, Depressed, Hopeless 0 0 0  PHQ - 2 Score 0 0 0  Altered sleeping 3 0 1  Tired, decreased energy 3 0 0  Change in appetite 3 0 1  Feeling bad or failure about yourself  0 0 0  Trouble concentrating 0 0 0  Moving slowly or  fidgety/restless 0 0 0  Suicidal thoughts 0 0 0  PHQ-9 Score 9  0  2   Difficult doing work/chores Not difficult at all  Not difficult at all     Data saved with a previous flowsheet row definition        10/24/2023    1:14 PM 09/13/2021   11:03 AM 03/02/2021    2:34 PM 05/24/2017    8:18 AM  GAD 7 : Generalized Anxiety Score  Nervous, Anxious, on Edge 0  0  0  1   Control/stop worrying 3  0  0  2   Worry too much - different things 3  0  0  2   Trouble relaxing 3  0  2  3   Restless 3  0  0  3   Easily annoyed or irritable 0  0  0  3   Afraid - awful might happen 0  0  0  0   Total GAD 7 Score 12 0 2 14  Anxiety Difficulty Not difficult at all   Very difficult     Data saved with a previous flowsheet row definition    Assessment and Plan:  Pregnancy: G2P1001 at [redacted]w[redacted]d 1. [redacted] weeks gestation of pregnancy (Primary) - Strep Gp B NAA  2. Gestational diabetes mellitus (GDM) in third trimester, gestational diabetes method of control unspecified Normal AM fasting and post prandial numbers Pt set up for 2/2 MN IOL 1/19: efw 14% (vs 18% on  12/22), 2666g, ac 45%, afi 11.8, cephalic  3. Pregnancy headache in third trimester Much improved which patient attributes to need for more rest. Do imaging PRN  Term labor symptoms and general obstetric precautions including but not limited to vaginal bleeding, contractions, leaking of fluid and fetal movement were reviewed in detail with the patient. Please refer to After Visit Summary for other counseling recommendations.   No follow-ups on file.  Future Appointments  Date Time Provider Department Center  05/01/2024 10:15 AM Izell Harari, MD [facility]  05/08/2024 10:35 AM Emilio Delilah HERO, CNM [facility]    Harari Izell, MD  "

## 2024-04-24 LAB — STREP GP B NAA: Strep Gp B NAA: NEGATIVE

## 2024-04-29 ENCOUNTER — Telehealth (HOSPITAL_COMMUNITY): Payer: Self-pay | Admitting: *Deleted

## 2024-04-29 ENCOUNTER — Encounter (HOSPITAL_COMMUNITY): Payer: Self-pay | Admitting: *Deleted

## 2024-04-29 NOTE — Telephone Encounter (Signed)
 Preadmission screen

## 2024-05-01 ENCOUNTER — Encounter: Admitting: Obstetrics and Gynecology

## 2024-05-03 ENCOUNTER — Inpatient Hospital Stay (HOSPITAL_COMMUNITY)
Admission: AD | Admit: 2024-05-03 | Discharge: 2024-05-04 | DRG: 807 | Disposition: A | Attending: Obstetrics & Gynecology | Admitting: Obstetrics & Gynecology

## 2024-05-03 ENCOUNTER — Other Ambulatory Visit: Payer: Self-pay

## 2024-05-03 ENCOUNTER — Telehealth: Payer: Self-pay | Admitting: Family Medicine

## 2024-05-03 ENCOUNTER — Encounter (HOSPITAL_COMMUNITY): Payer: Self-pay | Admitting: Obstetrics & Gynecology

## 2024-05-03 DIAGNOSIS — Z3A38 38 weeks gestation of pregnancy: Secondary | ICD-10-CM

## 2024-05-03 DIAGNOSIS — O24420 Gestational diabetes mellitus in childbirth, diet controlled: Secondary | ICD-10-CM | POA: Diagnosis present

## 2024-05-03 DIAGNOSIS — O26893 Other specified pregnancy related conditions, third trimester: Secondary | ICD-10-CM | POA: Diagnosis present

## 2024-05-03 DIAGNOSIS — Z8249 Family history of ischemic heart disease and other diseases of the circulatory system: Secondary | ICD-10-CM | POA: Diagnosis not present

## 2024-05-03 DIAGNOSIS — Z87891 Personal history of nicotine dependence: Secondary | ICD-10-CM

## 2024-05-03 DIAGNOSIS — Z833 Family history of diabetes mellitus: Secondary | ICD-10-CM | POA: Diagnosis not present

## 2024-05-03 LAB — CBC
HCT: 34.9 % — ABNORMAL LOW (ref 36.0–46.0)
Hemoglobin: 11.6 g/dL — ABNORMAL LOW (ref 12.0–15.0)
MCH: 30.4 pg (ref 26.0–34.0)
MCHC: 33.2 g/dL (ref 30.0–36.0)
MCV: 91.4 fL (ref 80.0–100.0)
Platelets: 265 10*3/uL (ref 150–400)
RBC: 3.82 MIL/uL — ABNORMAL LOW (ref 3.87–5.11)
RDW: 13.4 % (ref 11.5–15.5)
WBC: 12.5 10*3/uL — ABNORMAL HIGH (ref 4.0–10.5)
nRBC: 0 % (ref 0.0–0.2)

## 2024-05-03 LAB — SYPHILIS: RPR W/REFLEX TO RPR TITER AND TREPONEMAL ANTIBODIES, TRADITIONAL SCREENING AND DIAGNOSIS ALGORITHM: RPR Ser Ql: NONREACTIVE

## 2024-05-03 LAB — TYPE AND SCREEN
ABO/RH(D): O POS
Antibody Screen: NEGATIVE

## 2024-05-03 LAB — POCT FERN TEST: POCT Fern Test: NEGATIVE

## 2024-05-03 MED ORDER — TETANUS-DIPHTH-ACELL PERTUSSIS 5-2-15.5 LF-MCG/0.5 IM SUSP: 0.5000 mL | Freq: Once | INTRAMUSCULAR | Status: DC

## 2024-05-03 MED ORDER — ONDANSETRON HCL 4 MG PO TABS: 4.0000 mg | ORAL_TABLET | ORAL | Status: DC | PRN

## 2024-05-03 MED ORDER — DIPHENHYDRAMINE HCL 25 MG PO CAPS: 25.0000 mg | ORAL_CAPSULE | Freq: Four times a day (QID) | ORAL | Status: DC | PRN

## 2024-05-03 MED ORDER — WITCH HAZEL-GLYCERIN EX PADS: 1.0000 | MEDICATED_PAD | CUTANEOUS | Status: DC | PRN

## 2024-05-03 MED ORDER — EPHEDRINE 5 MG/ML INJ: 10.0000 mg | INTRAVENOUS | Status: DC | PRN

## 2024-05-03 MED ORDER — FENTANYL-BUPIVACAINE-NACL 0.5-0.125-0.9 MG/250ML-% EP SOLN
12.0000 mL/h | EPIDURAL | Status: DC | PRN
  Filled 2024-05-03: qty 250

## 2024-05-03 MED ORDER — SENNOSIDES-DOCUSATE SODIUM 8.6-50 MG PO TABS
2.0000 | ORAL_TABLET | Freq: Every day | ORAL | Status: DC
  Administered 2024-05-04: 2 via ORAL
  Filled 2024-05-03: qty 2

## 2024-05-03 MED ORDER — SOD CITRATE-CITRIC ACID 500-334 MG/5ML PO SOLN: 30.0000 mL | ORAL | Status: DC | PRN

## 2024-05-03 MED ORDER — ONDANSETRON HCL 4 MG/2ML IJ SOLN: 4.0000 mg | INTRAMUSCULAR | Status: DC | PRN

## 2024-05-03 MED ORDER — FENTANYL CITRATE (PF) 100 MCG/2ML IJ SOLN
50.0000 ug | INTRAMUSCULAR | Status: DC | PRN
  Administered 2024-05-03: 100 ug via INTRAVENOUS
  Filled 2024-05-03: qty 2

## 2024-05-03 MED ORDER — BENZOCAINE-MENTHOL 20-0.5 % EX AERO
1.0000 | INHALATION_SPRAY | CUTANEOUS | Status: DC | PRN
  Administered 2024-05-03: 1 via TOPICAL
  Filled 2024-05-03: qty 56

## 2024-05-03 MED ORDER — ZOLPIDEM TARTRATE 5 MG PO TABS: 5.0000 mg | ORAL_TABLET | Freq: Every evening | ORAL | Status: DC | PRN

## 2024-05-03 MED ORDER — PRENATAL MULTIVITAMIN CH
1.0000 | ORAL_TABLET | Freq: Every day | ORAL | Status: DC
  Administered 2024-05-03 – 2024-05-04 (×2): 1 via ORAL
  Filled 2024-05-03 (×2): qty 1

## 2024-05-03 MED ORDER — PHENYLEPHRINE 80 MCG/ML (10ML) SYRINGE FOR IV PUSH (FOR BLOOD PRESSURE SUPPORT): 80.0000 ug | PREFILLED_SYRINGE | INTRAVENOUS | Status: DC | PRN

## 2024-05-03 MED ORDER — LACTATED RINGERS IV SOLN: 500.0000 mL | Freq: Once | INTRAVENOUS | Status: DC

## 2024-05-03 MED ORDER — ACETAMINOPHEN 325 MG PO TABS
650.0000 mg | ORAL_TABLET | ORAL | Status: DC | PRN
  Administered 2024-05-03: 650 mg via ORAL
  Filled 2024-05-03: qty 2

## 2024-05-03 MED ORDER — DIPHENHYDRAMINE HCL 50 MG/ML IJ SOLN: 12.5000 mg | INTRAMUSCULAR | Status: DC | PRN

## 2024-05-03 MED ORDER — OXYTOCIN-SODIUM CHLORIDE 30-0.9 UT/500ML-% IV SOLN
2.5000 [IU]/h | INTRAVENOUS | Status: DC
  Administered 2024-05-03: 2.5 [IU]/h via INTRAVENOUS
  Filled 2024-05-03: qty 500

## 2024-05-03 MED ORDER — LIDOCAINE HCL (PF) 1 % IJ SOLN: 30.0000 mL | INTRAMUSCULAR | Status: DC | PRN

## 2024-05-03 MED ORDER — SIMETHICONE 80 MG PO CHEW: 80.0000 mg | CHEWABLE_TABLET | ORAL | Status: DC | PRN

## 2024-05-03 MED ORDER — ONDANSETRON HCL 4 MG/2ML IJ SOLN: 4.0000 mg | Freq: Four times a day (QID) | INTRAMUSCULAR | Status: DC | PRN

## 2024-05-03 MED ORDER — OXYTOCIN BOLUS FROM INFUSION
333.0000 mL | Freq: Once | INTRAVENOUS | Status: AC
  Administered 2024-05-03: 333 mL via INTRAVENOUS

## 2024-05-03 MED ORDER — COCONUT OIL OIL
1.0000 | TOPICAL_OIL | Status: DC | PRN
  Administered 2024-05-03: 1 via TOPICAL

## 2024-05-03 MED ORDER — DIBUCAINE (PERIANAL) 1 % EX OINT: 1.0000 | TOPICAL_OINTMENT | CUTANEOUS | Status: DC | PRN

## 2024-05-03 MED ORDER — LACTATED RINGERS IV SOLN: 500.0000 mL | INTRAVENOUS | Status: DC | PRN

## 2024-05-03 MED ORDER — LACTATED RINGERS IV SOLN: INTRAVENOUS | Status: DC

## 2024-05-03 MED ORDER — IBUPROFEN 600 MG PO TABS
600.0000 mg | ORAL_TABLET | Freq: Four times a day (QID) | ORAL | Status: DC
  Administered 2024-05-03 – 2024-05-04 (×4): 600 mg via ORAL
  Filled 2024-05-03 (×5): qty 1

## 2024-05-03 MED ORDER — ACETAMINOPHEN 325 MG PO TABS: 650.0000 mg | ORAL_TABLET | ORAL | Status: DC | PRN

## 2024-05-03 NOTE — Lactation Note (Signed)
 This note was copied from a Christie's chart. Lactation Consultation Note  Patient Name: Christie Reyes Today's Date: 05/03/2024 Age:27 hours Reason for consult: Initial assessment;1st time breastfeeding;Early term 37-38.6wks.  P2, this is MOB first time breastfeeding, LC gave pillow support, MOB latched infant on her right side using the football hold, infant sustained his latch and breastfeed for 11 minutes. MOB knows to call for continued latch assistance if needed. MOB will continue to breastfeed infant by cues, 8-12 times within 24 hours, skin to skin. MOB knows how to hand express and offer EBM on spoon 1st day of life if infant does not latch at breast. Infant had one void and one stool within the first 7 hours of life. LC discussed the importance of maternal rest, meals and hydration. MOB was  made aware of O/P services, breastfeeding support groups, community resources, and our phone # for post-discharge questions.    Maternal Data Has patient been taught Hand Expression?: Yes Does the patient have breastfeeding experience prior to this delivery?: No  Feeding Mother's Current Feeding Choice: Breast Milk  LATCH Score Latch: Grasps breast easily, tongue down, lips flanged, rhythmical sucking.  Audible Swallowing: A few with stimulation  Type of Nipple: Everted at rest and after stimulation  Comfort (Breast/Nipple): Soft / non-tender  Hold (Positioning): Assistance needed to correctly position infant at breast and maintain latch.  LATCH Score: 8   Lactation Tools Discussed/Used    Interventions Interventions: Breast feeding basics reviewed;Assisted with latch;Skin to skin;Breast compression;Adjust position;Support pillows;Position options;Expressed milk;Hand express;DEBP;Education;CDC milk storage guidelines;CDC Guidelines for Breast Pump Cleaning;LC Services brochure;Guidelines for Milk Supply and Pumping Schedule Handout  Discharge Pump: DEBP;Personal  Consult  Status Consult Status: Follow-up Date: 05/04/24 Follow-up type: In-patient    Grayce LULLA Batter 05/03/2024, 5:29 PM

## 2024-05-03 NOTE — Discharge Summary (Shared)
 "    Postpartum Discharge Summary  Date of Service updated***     Patient Name: Christie Reyes DOB: [birth year] MRN: [id number]  Date of admission: 05/03/2024 Delivery date:05/03/2024 Delivering provider: ARLYSS MITZIE PARAS Date of discharge: 05/03/2024  Admitting diagnosis: Indication for care in labor or delivery [O75.9] Intrauterine pregnancy: [redacted]w[redacted]d     Secondary diagnosis:  Principal Problem:   Indication for care in labor or delivery  Additional problems: ***    Discharge diagnosis: {DX.:23714}                                              Post partum procedures:{Postpartum procedures:23558} Augmentation: N/A Complications: None  Hospital course: Onset of Labor With Vaginal Delivery      29 y.o. yo G2P1001 at [redacted]w[redacted]d was admitted in Active Labor on 05/03/2024. Labor course was uncomplicated.  Membrane Rupture Time/Date: 9:18 AM,05/03/2024  Delivery Method:Vaginal, Spontaneous Operative Delivery:N/A Episiotomy: None Lacerations:  1st degree Patient had a postpartum course complicated by ***.  She is ambulating, tolerating a regular diet, passing flatus, and urinating well. Patient is discharged home in stable condition on 05/03/24.  Newborn Data: Birth date:05/03/2024 Birth time:9:42 AM Gender:Female Living status:Living Apgars:9 ,  Weight:   Magnesium Sulfate received: {Mag received:30440022} BMZ received: {BMZ received:30440023} Rhophylac:{Rhophylac received:30440032} FFM:{FFM:69559966} T-DaP:{Tdap:23962} Flu: {Qol:76036} RSV Vaccine received: {RSV:31013} Transfusion:{Transfusion received:30440034}  Immunizations received: Immunization History  Administered Date(s) Administered   DTaP 06/05/1998, 08/14/1998, 09/18/1998, 09/27/1999, 04/28/2003   HIB, Unspecified 06/05/1998, 08/14/1998, 09/18/1998, 03/26/1999   HPV 9-valent 10/18/2016, 11/20/2019   HPV Quadrivalent 02/09/2016   Hepatitis A, Ped/Adol-2 Dose 07/27/2007, 03/25/2009   Hepatitis B, PED/ADOLESCENT  06/05/1998, 08/14/1998, 09/27/1999   IPV 06/05/1998, 08/14/1998, 06/27/1999, 04/28/2003   Influenza,inj,quad, With Preservative 03/23/2006   MMR 03/26/1999, 04/28/2003   Meningococcal Acwy, Unspecified 03/25/2009, 11/11/2014   Pneumococcal Conjugate PCV 7 09/27/1999, 01/14/2000   Td 04/05/2003   Tdap 03/25/2009   Varicella 09/27/1999, 03/25/2009    Physical exam  Vitals:   05/03/24 0801 05/03/24 0950 05/03/24 1000 05/03/24 1005  BP: 118/81 (!) 157/93 122/69   Pulse: 92 96 96   Resp: 20   18  Temp: 98.7 F (37.1 C)   98.5 F (36.9 C)  TempSrc: Oral   Oral  SpO2:      Weight:      Height:       General: {Exam; general:21111117} Lochia: {Desc; appropriate/inappropriate:30686::appropriate} Uterine Fundus: {Desc; firm/soft:30687} Incision: {Exam; incision:21111123} DVT Evaluation: {Exam; dvt:2111122} Labs: Lab Results  Component Value Date   WBC 12.5 (H) 05/03/2024   HGB 11.6 (L) 05/03/2024   HCT 34.9 (L) 05/03/2024   MCV 91.4 05/03/2024   PLT 265 05/03/2024      Latest Ref Rng & Units 10/24/2023    1:37 PM  CMP  Glucose 70 - 99 mg/dL 72   BUN 6 - 20 mg/dL 7   Creatinine 9.42 - 8.99 mg/dL 9.23   Sodium 865 - 855 mmol/L 140   Potassium 3.5 - 5.2 mmol/L 4.0   Chloride 96 - 106 mmol/L 102   CO2 20 - 29 mmol/L 20   Calcium 8.7 - 10.2 mg/dL 9.6   Total Protein 6.0 - 8.5 g/dL 6.6   Total Bilirubin 0.0 - 1.2 mg/dL 0.3   Alkaline Phos 44 - 121 IU/L 76   AST 0 - 40 IU/L 9   ALT 0 - 32  IU/L 11    Edinburgh Score:    11/23/2021    9:23 AM  Edinburgh Postnatal Depression Scale Screening Tool  I have been able to laugh and see the funny side of things. 0   I have looked forward with enjoyment to things. 0   I have blamed myself unnecessarily when things went wrong. 0   I have been anxious or worried for no good reason. 0   I have felt scared or panicky for no good reason. 0   Things have been getting on top of me. 1   I have been so unhappy that I have had difficulty  sleeping. 0   I have felt sad or miserable. 0   I have been so unhappy that I have been crying. 0   The thought of harming myself has occurred to me. 0   Edinburgh Postnatal Depression Scale Total 1      Data saved with a previous flowsheet row definition   No data recorded  After visit meds:  Allergies as of 05/03/2024       Reactions   Cefdinir Anaphylaxis, Shortness Of Breath   Egg Protein-containing Drug Products Anaphylaxis   Elemental Sulfur Shortness Of Breath, Swelling   Other Anaphylaxis, Cough, Rash   Dustmites, cats   Sulfa Antibiotics Anaphylaxis, Swelling   Cat Dander    Influenza Vac Typ  [influenza Virus Vaccine]    Allergy to eggs   Sulfamethoxazole Swelling     Med Rec must be completed prior to using this SMARTLINK***        Discharge home in stable condition Infant Feeding: {Baby feeding:23562} Infant Disposition:{CHL IP OB HOME WITH FNUYZM:76418} Discharge instruction: per After Visit Summary and Postpartum booklet. Activity: Advance as tolerated. Pelvic rest for 6 weeks.  Diet: {OB ipzu:78888878} Future Appointments: Future Appointments  Date Time Provider Department Center  05/05/2024  7:15 AM [facility] SCHED ROOM [facility] None  05/08/2024 10:35 AM Emilio Delilah HERO, CNM [facility]   Follow up Visit:   Please schedule this patient for a {Visit type:23955} postpartum visit in {Postpartum visit:23953} with the following provider: {Provider type:23954}. Additional Postpartum F/U:{PP Procedure:23957}  {Risk level:23960} pregnancy complicated by: {complication:23959} Delivery mode:  Vaginal, Spontaneous Anticipated Birth Control:  {Birth Control:23956}   05/03/2024 Mitzie JINNY Molly, CNM    "

## 2024-05-03 NOTE — H&P (Signed)
 OBSTETRIC ADMISSION HISTORY AND PHYSICAL  Christie Reyes is a 29 y.o. female G2P1001 with IUP at [redacted]w[redacted]d by US  presenting for SOL. She reports +FMs, No LOF, no VB, no blurry vision, headaches or peripheral edema, and RUQ pain.  She plans on breast feeding. She request no birth control. She received her prenatal care at [facility].   Dating: By US  --->  Estimated Date of Delivery: 05/11/24  Sono:    @[redacted]w[redacted]d , CWD, normal anatomy, cephalic presentation, posterior placenta, 2666g, 14% EFW   Prenatal History/Complications: GDM  Past Medical History: Past Medical History:  Diagnosis Date   Abnormal antenatal AFP screen 08/02/2021   Acute pyelonephritis 01/16/2019   Anxiety    ASCUS with positive high risk HPV cervical 11/20/2019   Depression    Gestational diabetes    Herpes zoster without complication 03/17/2021   Migraine    PCOS (polycystic ovarian syndrome)    Renal stones     Past Surgical History: Past Surgical History:  Procedure Laterality Date   adnoidectomy and tonsilectomy     TONSILLECTOMY AND ADENOIDECTOMY     WISDOM TOOTH EXTRACTION      Obstetrical History: OB History     Gravida  2   Para  1   Term  1   Preterm      AB      Living  1      SAB      IAB      Ectopic      Multiple      Live Births  1        Obstetric Comments  2023 oligo         Social History Social History   Socioeconomic History   Marital status: Single    Spouse name: Not on file   Number of children: Not on file   Years of education: 12   Highest education level: 12th grade  Occupational History   Occupation: nail tech  Tobacco Use   Smoking status: Never   Smokeless tobacco: Never  Vaping Use   Vaping status: Never Used  Substance and Sexual Activity   Alcohol use: Not Currently   Drug use: Not Currently    Types: Marijuana    Comment: Last smoked jan 2023   Sexual activity: Yes    Birth control/protection: None  Other Topics Concern   Not on file   Social History Narrative   Not on file   Social Drivers of Health   Tobacco Use: Low Risk (05/03/2024)   Patient History    Smoking Tobacco Use: Never    Smokeless Tobacco Use: Never    Passive Exposure: Not on file  Recent Concern: Tobacco Use - Medium Risk (03/25/2024)   Patient History    Smoking Tobacco Use: Former    Smokeless Tobacco Use: Never    Passive Exposure: Not on Actuary Strain: Not on file  Food Insecurity: No Food Insecurity (05/03/2024)   Epic    Worried About Programme Researcher, Broadcasting/film/video in the Last Year: Never true    [employer] of Food in the Last Year: Never true  Transportation Needs: No Transportation Needs (05/03/2024)   Epic    Lack of Transportation (Medical): No    Lack of Transportation (Non-Medical): No  Physical Activity: Not on file  Stress: Not on file  Social Connections: Unknown (08/15/2021)   Received [facility]   Social Network    Social Network: Not on file  Depression (PHQ2-9): Medium Risk (10/24/2023)   Depression (PHQ2-9)    PHQ-2 Score: 9  Alcohol Screen: Not on file  Housing: Low Risk (05/03/2024)   Epic    Unable to Pay for Housing in the Last Year: No    Number of Times Moved in the Last Year: 0    Homeless in the Last Year: No  Utilities: Not At Risk (05/03/2024)   Epic    Threatened with loss of utilities: No  Health Literacy: Not on file    Family History: Family History  Problem Relation Age of Onset   Diabetes Mother    Hypertension Mother    Cancer Father        brain   Diabetes Father    Diabetes Paternal Grandmother     Allergies: Allergies[1]  Medications Prior to Admission  Medication Sig Dispense Refill Last Dose/Taking   Accu-Chek Softclix Lancets lancets 1 each by Other route 4 (four) times daily. 100 each 12    acetaminophen  (TYLENOL ) 500 MG tablet Take 1,000 mg by mouth every 6 (six) hours as needed for moderate pain (pain score 4-6) or mild pain (pain score 1-3).      Blood Glucose  Monitoring Suppl (ACCU-CHEK GUIDE) w/Device KIT 1 Device by Does not apply route 4 (four) times daily. 1 kit 0    busPIRone  (BUSPAR ) 7.5 MG tablet Take 1 tablet (7.5 mg total) by mouth 2 (two) times daily. (Patient not taking: No sig reported) 90 tablet 3    EPINEPHrine (EPIPEN JR 2-PAK) 0.15 MG/0.3ML injection Inject into the muscle. (Patient not taking: No sig reported)      famotidine  (PEPCID ) 20 MG tablet Take 1 tablet (20 mg total) by mouth 2 (two) times daily. (Patient not taking: No sig reported) 60 tablet 0    glucose blood test strip Use four times a day 100 each 12    hydrOXYzine  (ATARAX ) 10 MG tablet Take 1 tablet (10 mg total) by mouth 3 (three) times daily as needed. (Patient not taking: No sig reported) 30 tablet 0    metoCLOPramide  (REGLAN ) 10 MG tablet Take 1 tablet (10 mg total) by mouth 4 (four) times daily as needed for nausea or vomiting (Headache). 30 tablet 0    ondansetron  (ZOFRAN -ODT) 4 MG disintegrating tablet Take 1 tablet (4 mg total) by mouth every 8 (eight) hours as needed. (Patient not taking: No sig reported) 30 tablet 0    Prenatal Vit-Fe Fumarate-FA (PREPLUS) 27-1 MG TABS Take 1 tablet by mouth daily. 30 tablet 13      Review of Systems   All systems reviewed and negative except as stated in HPI  Blood pressure 122/80, pulse 99, resp. rate 20, height 5' 3 (1.6 m), weight 90.7 kg, last menstrual period 06/17/2023, SpO2 100%. General appearance: alert, cooperative, and no distress Lungs: clear to auscultation bilaterally Heart: regular rate and rhythm Abdomen: soft, non-tender; bowel sounds normal Pelvic: deferred Extremities: Homans sign is negative, no sign of DVT Presentation: cephalic Fetal monitoringBaseline: 130 bpm, Variability: Good {> 6 bpm), Accelerations: Reactive, and Decelerations: Absent Uterine activity: 2-3 mins Dilation: 8 Effacement (%): 90 Station: -1 Exam by:: Polos RN   Prenatal labs: ABO, Rh: --/--/O POS (01/30 0710) Antibody:  NEG (01/30 0710) Rubella: <0.90 (07/22 1337) RPR: Non Reactive (11/25 0841)  HBsAg: Negative (07/22 1337)  HIV: Non Reactive (11/25 0841)  GBS: Negative/-- (01/19 1500)    Lab Results  Component Value Date   GBS Negative 04/22/2024   GTT 132,  97, 97 Genetic screening  WNL Anatomy US  WNL  Immunization History  Administered Date(s) Administered   DTaP 06/05/1998, 08/14/1998, 09/18/1998, 09/27/1999, 04/28/2003   HIB, Unspecified 06/05/1998, 08/14/1998, 09/18/1998, 03/26/1999   HPV 9-valent 10/18/2016, 11/20/2019   HPV Quadrivalent 02/09/2016   Hepatitis A, Ped/Adol-2 Dose 07/27/2007, 03/25/2009   Hepatitis B, PED/ADOLESCENT 06/05/1998, 08/14/1998, 09/27/1999   IPV 06/05/1998, 08/14/1998, 06/27/1999, 04/28/2003   Influenza,inj,quad, With Preservative 03/23/2006   MMR 03/26/1999, 04/28/2003   Meningococcal Acwy, Unspecified 03/25/2009, 11/11/2014   Pneumococcal Conjugate PCV 7 09/27/1999, 01/14/2000   Td 04/05/2003   Tdap 03/25/2009   Varicella 09/27/1999, 03/25/2009    Prenatal Transfer Tool  Maternal Diabetes: Yes:  Diabetes Type:  Diet controlled Genetic Screening: Normal Maternal Ultrasounds/Referrals: Normal Fetal Ultrasounds or other Referrals:  None Maternal Substance Abuse:  No Significant Maternal Medications:  None Significant Maternal Lab Results: Group B Strep negative Number of Prenatal Visits:greater than 3 verified prenatal visits Maternal Vaccinations:None Other Comments:  None   Results for orders placed or performed during the hospital encounter of 05/03/24 (from the past 24 hours)  POCT fern test   Collection Time: 05/03/24  5:00 AM  Result Value Ref Range   POCT Fern Test Negative = intact amniotic membranes   CBC   Collection Time: 05/03/24  7:10 AM  Result Value Ref Range   WBC 12.5 (H) 4.0 - 10.5 K/uL   RBC 3.82 (L) 3.87 - 5.11 MIL/uL   Hemoglobin 11.6 (L) 12.0 - 15.0 g/dL   HCT 65.0 (L) 63.9 - 53.9 %   MCV 91.4 80.0 - 100.0 fL   MCH 30.4  26.0 - 34.0 pg   MCHC 33.2 30.0 - 36.0 g/dL   RDW 86.5 88.4 - 84.4 %   Platelets 265 150 - 400 K/uL   nRBC 0.0 0.0 - 0.2 %  Type and screen   Collection Time: 05/03/24  7:10 AM  Result Value Ref Range   ABO/RH(D) O POS    Antibody Screen NEG    Sample Expiration      05/06/2024,2359 Performed at [facility], [address] [place], [place] [address]     Patient Active Problem List   Diagnosis Date Noted   Indication for care in labor or delivery 05/03/2024   Gestational diabetes mellitus (GDM) in third trimester 03/02/2024   Rubella non-immune status, antepartum 10/25/2023   Anxiety    Supervision of other normal pregnancy, antepartum 09/25/2023   History of oligohydramnios in prior pregnancy, currently pregnant 09/25/2023   Migraine 03/12/2012    Assessment/Plan:  Christie Reyes is a 29 y.o. G2P1001 at [redacted]w[redacted]d here for SOL  #Labor:Expectant management #Pain: None per patient request #FWB: Cat 1 #GBS status:  negative #Feeding: Breastmilk  #Reproductive Life planning: None #Circ:  yes  Christie Reyes, CNM  05/03/2024, 9:04 AM       [1]  Allergies Allergen Reactions   Cefdinir Anaphylaxis and Shortness Of Breath   Egg Protein-Containing Drug Products Anaphylaxis   Elemental Sulfur Shortness Of Breath and Swelling   Other Anaphylaxis, Cough and Rash    Dustmites, cats   Sulfa Antibiotics Anaphylaxis and Swelling   Cat Dander    Influenza Vac Typ  [Influenza Virus Vaccine]     Allergy to eggs   Sulfamethoxazole Swelling

## 2024-05-03 NOTE — MAU Note (Signed)
 Christie Reyes is a 29 y.o. at [redacted]w[redacted]d here in MAU reporting leaking since 1100 Thurs am. States ctxs have been regular for 3 hours. Reports good FM. Denies VB. 2cm on Tues  LMP: na Onset of complaint: 3hrs Pain score: 10 Vitals:   05/03/24 0429 05/03/24 0430  BP:  122/80  Pulse: (!) 104 88  SpO2: 100%      FHT: 135  Lab orders placed from triage: labor eval

## 2024-05-04 ENCOUNTER — Other Ambulatory Visit (HOSPITAL_COMMUNITY): Payer: Self-pay

## 2024-05-04 MED ORDER — BENZOCAINE-MENTHOL 20-0.5 % EX AERO: 1.0000 | INHALATION_SPRAY | CUTANEOUS | Status: AC | PRN

## 2024-05-04 MED ORDER — SIMETHICONE 80 MG PO CHEW: 80.0000 mg | CHEWABLE_TABLET | ORAL | Status: AC | PRN

## 2024-05-04 MED ORDER — ACETAMINOPHEN 325 MG PO TABS: 650.0000 mg | ORAL_TABLET | ORAL | Status: AC | PRN

## 2024-05-04 MED ORDER — SENNOSIDES-DOCUSATE SODIUM 8.6-50 MG PO TABS
2.0000 | ORAL_TABLET | Freq: Every day | ORAL | 0 refills | Status: AC
  Filled 2024-05-04: qty 60, 30d supply, fill #0

## 2024-05-04 MED ORDER — WITCH HAZEL-GLYCERIN EX PADS: 1.0000 | MEDICATED_PAD | CUTANEOUS | Status: AC | PRN

## 2024-05-04 MED ORDER — IBUPROFEN 600 MG PO TABS
600.0000 mg | ORAL_TABLET | Freq: Four times a day (QID) | ORAL | 0 refills | Status: AC
  Filled 2024-05-04: qty 16, 4d supply, fill #0

## 2024-05-04 MED ORDER — DIPHENHYDRAMINE HCL 25 MG PO CAPS: 25.0000 mg | ORAL_CAPSULE | Freq: Four times a day (QID) | ORAL | Status: AC | PRN

## 2024-05-04 MED ORDER — DIBUCAINE (PERIANAL) 1 % EX OINT: 1.0000 | TOPICAL_OINTMENT | CUTANEOUS | Status: AC | PRN

## 2024-05-04 MED ORDER — COCONUT OIL OIL: 1.0000 | TOPICAL_OIL | Status: AC | PRN

## 2024-05-04 NOTE — Lactation Note (Signed)
 This note was copied from a Christie's chart. Lactation Consultation Note  Patient Name: Christie Reyes Today's Date: 05/04/2024 Age:27 hours Reason for consult: Follow-up assessment;1st time breastfeeding;Early term 37-38.6wks, infant weight loss -3.48% P1, LC assisted MOB with latching infant on her left breast using the cross cradle hold with pillow support. Per MOB, infant has not latch since his circumcision. MOB latched infant with depth, infant sustained his latch and swallow were heard, infant was still breastfeeding after 15 minutes when LC left the room. MOB will continue to breastfeed infant by cues, on demand, 8-12 times within 24 hours, skin to skin. MOB know to call for further latch assistance if needed. LC reinforced maternal rest, meals and hydration.   Maternal Data Has patient been taught Hand Expression?: Yes Does the patient have breastfeeding experience prior to this delivery?: No  Feeding Mother's Current Feeding Choice: Breast Milk  LATCH Score Latch: Grasps breast easily, tongue down, lips flanged, rhythmical sucking.  Audible Swallowing: Spontaneous and intermittent  Type of Nipple: Everted at rest and after stimulation  Comfort (Breast/Nipple): Soft / non-tender  Hold (Positioning): Assistance needed to correctly position infant at breast and maintain latch.  LATCH Score: 9   Lactation Tools Discussed/Used    Interventions Interventions: Assisted with latch;Skin to skin;Breast massage;Breast compression;Adjust position;Support pillows;Position options;Expressed milk  Discharge    Consult Status Consult Status: Follow-up Date: 05/05/24 Follow-up type: In-patient    Christie Reyes 05/04/2024, 12:13 PM

## 2024-05-04 NOTE — Progress Notes (Signed)
 Christie Reyes was referred for history of depression/anxiety. * Referral screened out by Clinical Social Worker because none of the following criteria appear to apply: ~ History of anxiety/depression during this pregnancy, or of post-partum depression following prior delivery. ~ Diagnosis of anxiety and/or depression within last 3 years OR * Christie Reyes's symptoms currently being treated with medication and/or therapy. No Concerns noted in OB records. Christie Reyes has an active Rx for Buspar  and Hydroxyzine .  Please contact the Clinical Social Worker if needs arise, by [facility] request, or if Christie Reyes scores greater than 9/yes to question 10 on Edinburgh Postpartum Depression Screen.   Clayborne Hope, MSW, LCSW Clinical Social Work [phone]

## 2024-05-05 ENCOUNTER — Inpatient Hospital Stay (HOSPITAL_COMMUNITY)

## 2024-05-06 ENCOUNTER — Inpatient Hospital Stay (HOSPITAL_COMMUNITY): Admission: RE | Admit: 2024-05-06 | Source: Home / Self Care | Admitting: Obstetrics & Gynecology

## 2024-05-06 ENCOUNTER — Inpatient Hospital Stay (HOSPITAL_COMMUNITY)

## 2024-05-08 ENCOUNTER — Encounter: Admitting: Certified Nurse Midwife

## 2024-06-14 ENCOUNTER — Other Ambulatory Visit

## 2024-06-14 ENCOUNTER — Ambulatory Visit: Admitting: Family Medicine
